# Patient Record
Sex: Female | Born: 2000 | Hispanic: No | Marital: Single | Smoking: Current every day smoker
Health system: Southern US, Community
[De-identification: ages and names within clinical notes are randomized; demographics above are authoritative.]

## PROBLEM LIST (undated history)

## (undated) ENCOUNTER — Inpatient Hospital Stay: Payer: Self-pay

---

## 2018-01-13 ENCOUNTER — Emergency Department
Admission: EM | Admit: 2018-01-13 | Discharge: 2018-01-14 | Disposition: A | Discharge status: Other Acute Inpatient Hospital | Payer: No Typology Code available for payment source | Attending: Emergency Medicine | Admitting: Emergency Medicine

## 2018-01-13 ENCOUNTER — Emergency Department: Payer: No Typology Code available for payment source

## 2018-01-13 DIAGNOSIS — T1491XA Suicide attempt, initial encounter: Secondary | ICD-10-CM | POA: Insufficient documentation

## 2018-01-13 DIAGNOSIS — T424X2A Poisoning by benzodiazepines, intentional self-harm, initial encounter: Secondary | ICD-10-CM | POA: Insufficient documentation

## 2018-01-13 DIAGNOSIS — Y929 Unspecified place or not applicable: Secondary | ICD-10-CM | POA: Insufficient documentation

## 2018-01-13 DIAGNOSIS — X838XXA Intentional self-harm by other specified means, initial encounter: Secondary | ICD-10-CM | POA: Insufficient documentation

## 2018-01-13 DIAGNOSIS — Y999 Unspecified external cause status: Secondary | ICD-10-CM | POA: Insufficient documentation

## 2018-01-13 DIAGNOSIS — R451 Restlessness and agitation: Secondary | ICD-10-CM | POA: Insufficient documentation

## 2018-01-13 DIAGNOSIS — F329 Major depressive disorder, single episode, unspecified: Secondary | ICD-10-CM | POA: Insufficient documentation

## 2018-01-13 DIAGNOSIS — T424X1A Poisoning by benzodiazepines, accidental (unintentional), initial encounter: Secondary | ICD-10-CM

## 2018-01-13 DIAGNOSIS — Y9389 Activity, other specified: Secondary | ICD-10-CM | POA: Insufficient documentation

## 2018-01-13 DIAGNOSIS — F39 Unspecified mood [affective] disorder: Secondary | ICD-10-CM

## 2018-01-13 DIAGNOSIS — F1010 Alcohol abuse, uncomplicated: Secondary | ICD-10-CM

## 2018-01-13 DIAGNOSIS — Z046 Encounter for general psychiatric examination, requested by authority: Secondary | ICD-10-CM | POA: Insufficient documentation

## 2018-01-13 DIAGNOSIS — F1021 Alcohol dependence, in remission: Secondary | ICD-10-CM

## 2018-01-13 MED ORDER — CHARCOAL ACTIVATED PO LIQD
50.0000 g | Freq: Once | ORAL | Status: DC
  Filled 2018-01-13: qty 240

## 2018-01-13 MED ORDER — SODIUM CHLORIDE 0.9 % IV BOLUS
1000.0000 mL | Freq: Once | INTRAVENOUS | Status: AC
Start: 2018-01-14 — End: 2018-01-14
  Administered 2018-01-14: 1000 mL via INTRAVENOUS

## 2018-01-13 NOTE — ED Provider Notes (Signed)
[facility] Emergency Department Provider Note   ____________________________________________   First MD Initiated Contact with Patient 01/13/18 2346     (approximate)  I have reviewed the triage vital signs and the nursing notes.   HISTORY  Chief Complaint Ingestion    HPI Kristina Gay is a 17 y.o. female brought to the ED from home via EMS with a chief complaint of intentional overdose.  Reportedly 2 weeks ago patient had a DUI and her parents placed her in [facility].  Tonight patient posted on Facebook that she intentionally overdosed on 60 Klonopin tablets (unknown milligram) as well as drank alcohol.  Tells me she attempted to vomit after she ingested the pills but did not.  Her mother saw her Facebook post and called 911.  Currently patient voices no medical complaints and is eager for discharge home.  Specifically patient denies recent fever, chills, chest pain, shortness of breath, abdominal pain, nausea or vomiting.  Denies trauma.  She is becoming irritated and cursing at staff.   Past medical history None   There are no active problems to display for this patient.    Prior to Admission medications   Not on File    Allergies Patient has no known allergies.  No family history on file.  Social History Social History   Tobacco Use  . Smoking status: Not on file  Substance Use Topics  . Alcohol use: Not on file  . Drug use: Not on file  + EtOH   Review of Systems  Constitutional: No fever/chills. Eyes: No visual changes. ENT: No sore throat. Cardiovascular: Denies chest pain. Respiratory: Denies shortness of breath. Gastrointestinal: No abdominal pain.  No nausea, no vomiting.  No diarrhea.  No constipation. Genitourinary: Negative for dysuria. Musculoskeletal: Negative for back pain. Skin: Negative for rash. Neurological: Negative for headaches, focal weakness or numbness. Psychiatric:Positive for  suicide attempt.  ____________________________________________   PHYSICAL EXAM:  VITAL SIGNS: ED Triage Vitals  Enc Vitals Group     BP      Pulse      Resp      Temp      Temp src      SpO2      Weight      Height      Head Circumference      Peak Flow      Pain Score      Pain Loc      Pain Edu?      Excl. in GC?     Constitutional: Alert and oriented. Well appearing and in no acute distress. Intoxicated. Eyes: Conjunctivae are normal. PERRL. EOMI. Head: Atraumatic. Nose: No congestion/rhinnorhea. Mouth/Throat: Mucous membranes are moist.  Oropharynx non-erythematous. Neck: No stridor.   Cardiovascular: Normal rate, regular rhythm. Grossly normal heart sounds.  Good peripheral circulation. Respiratory: Normal respiratory effort.  No retractions. Lungs CTAB. Gastrointestinal: Soft and nontender. No distention. No abdominal bruits. No CVA tenderness. Musculoskeletal: No lower extremity tenderness nor edema.  No joint effusions. Neurologic:  Normal speech and language. No gross focal neurologic deficits are appreciated.  Skin:  Skin is warm, dry and intact. No rash noted. Psychiatric: Mood and affect are tearful; at times agitated. Speech and behavior are normal.  ____________________________________________   LABS (all labs ordered are listed, but only abnormal results are displayed)  Labs Reviewed  COMPREHENSIVE METABOLIC PANEL - Abnormal; Notable for the following components:      Result Value   Potassium 3.4 (*)  Glucose, Bld 113 (*)    ALT 12 (*)    All other components within normal limits  ACETAMINOPHEN LEVEL - Abnormal; Notable for the following components:   Acetaminophen (Tylenol), Serum <10 (*)    All other components within normal limits  ETHANOL - Abnormal; Notable for the following components:   Alcohol, Ethyl (B) 105 (*)    All other components within normal limits  URINE DRUG SCREEN, QUALITATIVE ([facility] ONLY) - Abnormal; Notable for the  following components:   Cannabinoid 50 Ng, Ur [place] POSITIVE (*)    Benzodiazepine, Ur Scrn POSITIVE (*)    All other components within normal limits  URINALYSIS, COMPLETE (UACMP) WITH MICROSCOPIC - Abnormal; Notable for the following components:   Color, Urine COLORLESS (*)    APPearance CLEAR (*)    Specific Gravity, Urine 1.003 (*)    Bacteria, UA RARE (*)    All other components within normal limits  BLOOD GAS, ARTERIAL - Abnormal; Notable for the following components:   Bicarbonate 18.8 (*)    Acid-base deficit 5.9 (*)    All other components within normal limits  ACETAMINOPHEN LEVEL - Abnormal; Notable for the following components:   Acetaminophen (Tylenol), Serum <10 (*)    All other components within normal limits  CBC WITH DIFFERENTIAL/PLATELET  SALICYLATE LEVEL  SALICYLATE LEVEL  POC URINE PREG, ED  POCT PREGNANCY, URINE   ____________________________________________  EKG  ED ECG REPORT I, Gayle Collard J, the attending physician, personally viewed and interpreted this ECG.   Date: 01/14/2018  EKG Time: 0000  Rate: 135  Rhythm: sinus tachycardia  Axis: Normal   Intervals:none  ST&T Change: Nonspecific  ____________________________________________  RADIOLOGY  ED MD interpretation: No acute cardiopulmonary process  Official radiology report(s): Dg Chest Port 1 View  Result Date: 01/14/2018 CLINICAL DATA:  Drug overdose EXAM: PORTABLE CHEST 1 VIEW COMPARISON:  None. FINDINGS: Portable AP upright view. Lung volumes are slightly low. The heart size and mediastinal contours are within normal limits. Both lungs are clear. The visualized skeletal structures are unremarkable. IMPRESSION: No active disease. Electronically Signed   By: Tollie Ethavid  Kwon M.D.   On: 01/14/2018 00:18    ____________________________________________   PROCEDURES  Procedure(s) performed: None  Procedures  Critical Care performed: Yes, see critical care note(s)    CRITICAL CARE Performed by:  Irean HongSUNG,Reva Pinkley J   Total critical care time: 45 minutes  Critical care time was exclusive of separately billable procedures and treating other patients.  Critical care was necessary to treat or prevent imminent or life-threatening deterioration.  Critical care was time spent personally by me on the following activities: development of treatment plan with patient and/or surrogate as well as nursing, discussions with consultants, evaluation of patient's response to treatment, examination of patient, obtaining history from patient or surrogate, ordering and performing treatments and interventions, ordering and review of laboratory studies, ordering and review of radiographic studies, pulse oximetry and re-evaluation of patient's condition.  ____________________________________________   INITIAL IMPRESSION / ASSESSMENT AND PLAN / ED COURSE  As part of my medical decision making, I reviewed the following data within the electronic MEDICAL RECORD NUMBER Nursing notes reviewed and incorporated, Labs reviewed, EKG interpreted, Old chart reviewed, Radiograph reviewed, A consult was requested and obtained from this/these consultant(s) Psychiatry and Notes from prior ED visits   17 year old female who presents with intentional overdose of Klonopin and alcohol. Differential diagnosis includes, but is not limited to, alcohol, illicit or prescription medications, or other toxic ingestion; intracranial pathology such as  stroke or intracerebral hemorrhage; fever or infectious causes including sepsis; hypoxemia and/or hypercarbia; uremia; trauma; endocrine related disorders such as diabetes, hypoglycemia, and thyroid-related diseases; hypertensive encephalopathy; etc.   IVC papers are coming from the sheriff's office.  Ingestion of 60 Klonopin approximately 1 hour ago.  Will attempt NG tube lavage, charcoal, initiate IV fluid resuscitation.  Continue cardiac monitoring.  Will call poison control for additional  recommendations.   Clinical Course as of Jan 14 726  Fri Jan 14, 2018  0020 Spoke with poison control who agrees with supportive management and continued cardiac monitoring.  Recommend 4-hour levels of acetaminophen and salicylate.   [JS]  0315 Patient sleeping in no acute distress.  Heart rate down to 115.  Will hang additional liter of IV fluids.  Remains hemodynamically stable.   [JS]  0503 Repeat acetaminophen and salicylate levels unremarkable.  Will continue to monitor patient.   [JS]  0545 Patient vomiting; will administer IV Zofran.  3rd liter normal saline to be infused.   [JS]  0656 Vomiting improved.  Heart rate coming down to 106.  Third liter fluids infusing.  Patient will remain in the emergency department under IVC pending psychiatry evaluation this morning.  Care transferred to Dr. Darnelle Catalan.   [JS]    Clinical Course User Index [JS] Irean Hong, MD     ____________________________________________   FINAL CLINICAL IMPRESSION(S) / ED DIAGNOSES  Final diagnoses:  Overdose of benzodiazepine, intentional self-harm, initial encounter [facility])     ED Discharge Orders    None       Note:  This document was prepared using Dragon voice recognition software and may include unintentional dictation errors.    Irean Hong, MD 01/14/18 [phone]
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
## 2018-01-14 ENCOUNTER — Other Ambulatory Visit: Payer: Self-pay

## 2018-01-14 ENCOUNTER — Encounter: Payer: Self-pay | Admitting: Psychiatry

## 2018-01-14 ENCOUNTER — Inpatient Hospital Stay
Admission: AD | Admit: 2018-01-14 | Discharge: 2018-01-18 | DRG: 882 | Disposition: A | Discharge status: Home / Self Care | Payer: No Typology Code available for payment source | Attending: Psychiatry | Admitting: Psychiatry

## 2018-01-14 DIAGNOSIS — F4323 Adjustment disorder with mixed anxiety and depressed mood: Secondary | ICD-10-CM | POA: Diagnosis not present

## 2018-01-14 DIAGNOSIS — T424X2A Poisoning by benzodiazepines, intentional self-harm, initial encounter: Secondary | ICD-10-CM | POA: Diagnosis not present

## 2018-01-14 DIAGNOSIS — F1010 Alcohol abuse, uncomplicated: Secondary | ICD-10-CM | POA: Diagnosis present

## 2018-01-14 DIAGNOSIS — F1210 Cannabis abuse, uncomplicated: Secondary | ICD-10-CM | POA: Diagnosis present

## 2018-01-14 DIAGNOSIS — F1021 Alcohol dependence, in remission: Secondary | ICD-10-CM

## 2018-01-14 DIAGNOSIS — T424X1A Poisoning by benzodiazepines, accidental (unintentional), initial encounter: Secondary | ICD-10-CM

## 2018-01-14 DIAGNOSIS — Z79899 Other long term (current) drug therapy: Secondary | ICD-10-CM | POA: Diagnosis not present

## 2018-01-14 DIAGNOSIS — F39 Unspecified mood [affective] disorder: Secondary | ICD-10-CM

## 2018-01-14 LAB — URINALYSIS, COMPLETE (UACMP) WITH MICROSCOPIC
Bilirubin Urine: NEGATIVE
Glucose, UA: NEGATIVE mg/dL
Hgb urine dipstick: NEGATIVE
KETONES UR: NEGATIVE mg/dL
LEUKOCYTES UA: NEGATIVE
Nitrite: NEGATIVE
PROTEIN: NEGATIVE mg/dL
Specific Gravity, Urine: 1.003 — ABNORMAL LOW (ref 1.005–1.030)
pH: 6 (ref 5.0–8.0)

## 2018-01-14 LAB — URINE DRUG SCREEN, QUALITATIVE (ARMC ONLY)
AMPHETAMINES, UR SCREEN: NOT DETECTED
Barbiturates, Ur Screen: NOT DETECTED
Benzodiazepine, Ur Scrn: POSITIVE — AB
Cannabinoid 50 Ng, Ur ~~LOC~~: POSITIVE — AB
Cocaine Metabolite,Ur ~~LOC~~: NOT DETECTED
MDMA (ECSTASY) UR SCREEN: NOT DETECTED
METHADONE SCREEN, URINE: NOT DETECTED
OPIATE, UR SCREEN: NOT DETECTED
PHENCYCLIDINE (PCP) UR S: NOT DETECTED
Tricyclic, Ur Screen: NOT DETECTED

## 2018-01-14 LAB — CBC WITH DIFFERENTIAL/PLATELET
BASOS ABS: 0 10*3/uL (ref 0–0.1)
BASOS PCT: 1 %
EOS ABS: 0.1 10*3/uL (ref 0–0.7)
EOS PCT: 1 %
HCT: 41.4 % (ref 35.0–47.0)
HEMOGLOBIN: 14.2 g/dL (ref 12.0–16.0)
Lymphocytes Relative: 20 %
Lymphs Abs: 1.2 10*3/uL (ref 1.0–3.6)
MCH: 31.4 pg (ref 26.0–34.0)
MCHC: 34.2 g/dL (ref 32.0–36.0)
MCV: 91.9 fL (ref 80.0–100.0)
Monocytes Absolute: 0.5 10*3/uL (ref 0.2–0.9)
Monocytes Relative: 8 %
Neutro Abs: 4 10*3/uL (ref 1.4–6.5)
Neutrophils Relative %: 70 %
PLATELETS: 331 10*3/uL (ref 150–440)
RBC: 4.51 MIL/uL (ref 3.80–5.20)
RDW: 13.6 % (ref 11.5–14.5)
WBC: 5.8 10*3/uL (ref 3.6–11.0)

## 2018-01-14 LAB — COMPREHENSIVE METABOLIC PANEL
ALBUMIN: 4.6 g/dL (ref 3.5–5.0)
ALT: 12 U/L — ABNORMAL LOW (ref 14–54)
AST: 19 U/L (ref 15–41)
Alkaline Phosphatase: 76 U/L (ref 38–126)
Anion gap: 11 (ref 5–15)
BUN: 7 mg/dL (ref 6–20)
CHLORIDE: 107 mmol/L (ref 101–111)
CO2: 22 mmol/L (ref 22–32)
CREATININE: 0.63 mg/dL (ref 0.44–1.00)
Calcium: 9.2 mg/dL (ref 8.9–10.3)
GFR calc non Af Amer: >60 mL/min (ref >60)
GLUCOSE: 113 mg/dL — AB (ref 65–99)
Potassium: 3.4 mmol/L — ABNORMAL LOW (ref 3.5–5.1)
SODIUM: 140 mmol/L (ref 135–145)
Total Bilirubin: 0.3 mg/dL (ref 0.3–1.2)
Total Protein: 8.1 g/dL (ref 6.5–8.1)

## 2018-01-14 LAB — BLOOD GAS, ARTERIAL
ACID-BASE DEFICIT: 5.9 mmol/L — AB (ref 0.0–2.0)
BICARBONATE: 18.8 mmol/L — AB (ref 20.0–28.0)
FIO2: 0.21
O2 Saturation: 97.4 %
PATIENT TEMPERATURE: 37
PO2 ART: 100 mmHg (ref 83.0–108.0)
pCO2 arterial: 34 mmHg (ref 32.0–48.0)
pH, Arterial: 7.35 (ref 7.350–7.450)

## 2018-01-14 LAB — ACETAMINOPHEN LEVEL: Acetaminophen (Tylenol), Serum: <10 ug/mL — ABNORMAL LOW (ref 10–30)

## 2018-01-14 LAB — ETHANOL: ALCOHOL ETHYL (B): 105 mg/dL — AB (ref <10)

## 2018-01-14 LAB — SALICYLATE LEVEL: Salicylate Lvl: <7 mg/dL (ref 2.8–30.0)

## 2018-01-14 LAB — POCT PREGNANCY, URINE: Preg Test, Ur: NEGATIVE

## 2018-01-14 MED ORDER — TRAZODONE HCL 100 MG PO TABS
100.0000 mg | ORAL_TABLET | Freq: Every evening | ORAL | Status: DC | PRN
  Administered 2018-01-15 – 2018-01-18 (×2): 100 mg via ORAL
  Filled 2018-01-14 (×2): qty 1

## 2018-01-14 MED ORDER — SODIUM CHLORIDE 0.9 % IV BOLUS
1000.0000 mL | Freq: Once | INTRAVENOUS | Status: AC
  Administered 2018-01-14: 1000 mL via INTRAVENOUS

## 2018-01-14 MED ORDER — OLANZAPINE 10 MG PO TABS
ORAL_TABLET | ORAL | Status: AC
  Administered 2018-01-14: 10 mg via ORAL
  Filled 2018-01-14: qty 1

## 2018-01-14 MED ORDER — ZIPRASIDONE MESYLATE 20 MG IM SOLR: 20.0000 mg | Freq: Four times a day (QID) | INTRAMUSCULAR | Status: DC | PRN

## 2018-01-14 MED ORDER — OLANZAPINE 10 MG PO TABS: 10.0000 mg | ORAL_TABLET | Freq: Four times a day (QID) | ORAL | Status: DC | PRN

## 2018-01-14 MED ORDER — MAGNESIUM HYDROXIDE 400 MG/5ML PO SUSP: 30.0000 mL | Freq: Every day | ORAL | Status: DC | PRN

## 2018-01-14 MED ORDER — OLANZAPINE 10 MG PO TABS
10.0000 mg | ORAL_TABLET | Freq: Four times a day (QID) | ORAL | Status: DC | PRN
  Administered 2018-01-14: 10 mg via ORAL

## 2018-01-14 MED ORDER — ALUM & MAG HYDROXIDE-SIMETH 200-200-20 MG/5ML PO SUSP: 30.0000 mL | ORAL | Status: DC | PRN

## 2018-01-14 MED ORDER — ONDANSETRON HCL 4 MG/2ML IJ SOLN
INTRAMUSCULAR | Status: AC
  Filled 2018-01-14: qty 2

## 2018-01-14 MED ORDER — ZIPRASIDONE MESYLATE 20 MG IM SOLR
INTRAMUSCULAR | Status: AC
  Filled 2018-01-14: qty 20

## 2018-01-14 MED ORDER — ACTIDOSE WITH SORBITOL 50 GM/240ML PO LIQD
50.0000 g | Freq: Once | ORAL | Status: AC
  Administered 2018-01-14: 50 g via ORAL

## 2018-01-14 MED ORDER — ACETAMINOPHEN 325 MG PO TABS: 650.0000 mg | ORAL_TABLET | Freq: Four times a day (QID) | ORAL | Status: DC | PRN

## 2018-01-14 MED ORDER — ONDANSETRON HCL 4 MG/2ML IJ SOLN: 4.0000 mg | Freq: Once | INTRAMUSCULAR | Status: DC

## 2018-01-14 NOTE — ED Notes (Signed)
Mom called and Spoke with her and gave her an update

## 2018-01-14 NOTE — ED Notes (Signed)
Wakens easily to voice. Oriented but drowsy.  Unlabored. Sitter remains present at bedside.

## 2018-01-14 NOTE — ED Notes (Signed)
Patient visting with her mother.

## 2018-01-14 NOTE — ED Notes (Signed)
Poison Control called back and spoke with this RN and given update (PC notified repeat salicylate and acetaminophen levels were not back yet), at this time PC states they are closing her case.
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
## 2018-01-14 NOTE — ED Notes (Signed)
IVC/ Consult completed/ Plan to admit when medically clear  

## 2018-01-14 NOTE — ED Notes (Signed)
Pt had 1 episode of large liquid emesis, EDP notified see MAR for follow up.

## 2018-01-14 NOTE — Consult Note (Signed)
[facility] Consult   Reason for Consult: Consult for 17 year old woman who came into the hospital after taking an overdose of a nasal pain M Referring Physician: Rip Harbour Patient Identification: Kristina Gay MRN:  [id number] Principal Diagnosis: Benzodiazepine overdose Diagnosis:   Patient Active Problem List   Diagnosis Date Noted  . Benzodiazepine overdose [T42.4X1A] 01/14/2018  . Alcohol abuse [F10.10] 01/14/2018  . Mood disorder ([place]) [F39] 01/14/2018    Total Time spent with patient: 1 hour  Subjective:   Kristina Gay is a 17 y.o. female patient admitted with "I was just trying to get high".  HPI: Patient seen chart reviewed.  17 year old woman came into the hospital after taking an overdose of Klonopin.  Patient tells me a slightly different story than what she told when she came in.  She admits that she was over at her boyfriend's house drinking yesterday.  She tells me that it was only part of a alcoholic drink but her blood alcohol level was well over 100 when she came in.  No one was around and she took an entire bottle of Klonopin.  She says she does not even know who his pills they were.  Today she tells me she did it because "I just wanted to get high" but last night when she came in it is documented that she said she wanted to kill herself.  Patient is telling me today that her mood is been good.  Some chronic trouble sleeping.  Says this was only the second time she has had any alcohol since leaving [facility].  Patient is very evasive and minimizes symptoms talking to me but was clearly showing signs of suicidality when she came in last night.  Not clear that she is on any psychiatric medicine or getting any outpatient treatment.  Medical history: No significant medical problems  Substance abuse history: History of abuse of alcohol and benzodiazepines in the past as well.  Just recently out of [facility] where she had been admitted after  drinking and expressing suicidal thoughts.  Social history: Lives with her mother.  Not currently working.  Past Psychiatric History: Patient has been seen before and was admitted to [facility] just a couple weeks or less ago.  History of substance abuse mood instability behavior problems and overdoses.  Risk to Self: Suicidal Ideation: No-Not Currently/Within Last 6 Months Suicidal Intent: No-Not Currently/Within Last 6 Months Is patient at risk for suicide?: Yes Suicidal Plan?: No-Not Currently/Within Last 6 Months Access to Means: Yes Specify Access to Suicidal Means: Medications What has been your use of drugs/alcohol within the last 12 months?: Benzo's & Medications How many times?: 0 Other Self Harm Risks: Active Addictions Triggers for Past Attempts: Other (Comment) Intentional Self Injurious Behavior: None Risk to Others: Homicidal Ideation: No Thoughts of Harm to Others: No Current Homicidal Intent: No Current Homicidal Plan: No Access to Homicidal Means: No Identified Victim: Reports of none History of harm to others?: No Assessment of Violence: None Noted Violent Behavior Description: Reports of none Does patient have access to weapons?: No Criminal Charges Pending?: No Does patient have a court date: No Prior Inpatient Therapy: Prior Inpatient Therapy: Yes Prior Therapy Dates: 11/2017 Prior Therapy Facilty/Provider(s): [facility] Reason for Treatment: Depression Prior Outpatient Therapy: Prior Outpatient Therapy: No Does patient have an ACCT team?: No Does patient have Intensive In-House Services?  : No Does patient have [facility] services? : No Does patient have P4CC services?: No  Past Medical History:  No past medical history on file.  Family History: No family history on file. Family Psychiatric  History: Unknown Social History:  Social History   Substance and Sexual Activity  Alcohol Use Not on file     Social History   Substance and Sexual  Activity  Drug Use Not on file    Social History   Socioeconomic History  . Marital status: Single    Spouse name: Not on file  . Number of children: Not on file  . Years of education: Not on file  . Highest education level: Not on file  Occupational History  . Not on file  Social Needs  . Financial resource strain: Not on file  . Food insecurity:    Worry: Not on file    Inability: Not on file  . Transportation needs:    Medical: Not on file    Non-medical: Not on file  Tobacco Use  . Smoking status: Not on file  Substance and Sexual Activity  . Alcohol use: Not on file  . Drug use: Not on file  . Sexual activity: Not on file  Lifestyle  . Physical activity:    Days per week: Not on file    Minutes per session: Not on file  . Stress: Not on file  Relationships  . Social connections:    Talks on phone: Not on file    Gets together: Not on file    Attends religious service: Not on file    Active member of club or organization: Not on file    Attends meetings of clubs or organizations: Not on file    Relationship status: Not on file  Other Topics Concern  . Not on file  Social History Narrative  . Not on file   Additional Social History:    Allergies:  No Known Allergies  Labs:  Results for orders placed or performed during the hospital encounter of 01/13/18 (from the past 48 hour(s))  CBC with Differential     Status: None   Collection Time: 01/13/18 11:50 PM  Result Value Ref Range   WBC 5.8 3.6 - 11.0 K/uL   RBC 4.51 3.80 - 5.20 MIL/uL   Hemoglobin 14.2 12.0 - 16.0 g/dL   HCT 41.4 35.0 - 47.0 %   MCV 91.9 80.0 - 100.0 fL   MCH 31.4 26.0 - 34.0 pg   MCHC 34.2 32.0 - 36.0 g/dL   RDW 13.6 11.5 - 14.5 %   Platelets 331 150 - 440 K/uL   Neutrophils Relative % 70 %   Neutro Abs 4.0 1.4 - 6.5 K/uL   Lymphocytes Relative 20 %   Lymphs Abs 1.2 1.0 - 3.6 K/uL   Monocytes Relative 8 %   Monocytes Absolute 0.5 0.2 - 0.9 K/uL   Eosinophils Relative 1 %    Eosinophils Absolute 0.1 0 - 0.7 K/uL   Basophils Relative 1 %   Basophils Absolute 0.0 0 - 0.1 K/uL    Comment: Performed at [facility], [address] [place], [place] [address]  Comprehensive metabolic panel     Status: Abnormal   Collection Time: 01/13/18 11:50 PM  Result Value Ref Range   Sodium 140 135 - 145 mmol/L   Potassium 3.4 (L) 3.5 - 5.1 mmol/L   Chloride 107 101 - 111 mmol/L   CO2 22 22 - 32 mmol/L   Glucose, Bld 113 (H) 65 - 99 mg/dL   BUN 7 6 - 20 mg/dL   Creatinine,  Ser 0.63 0.44 - 1.00 mg/dL   Calcium 9.2 8.9 - 10.3 mg/dL   Total Protein 8.1 6.5 - 8.1 g/dL   Albumin 4.6 3.5 - 5.0 g/dL   AST 19 15 - 41 U/L   ALT 12 (L) 14 - 54 U/L   Alkaline Phosphatase 76 38 - 126 U/L   Total Bilirubin 0.3 0.3 - 1.2 mg/dL   GFR calc non Af Amer >60 >60 mL/min   GFR calc Af Amer >60 >60 mL/min    Comment: (NOTE) The eGFR has been calculated using the CKD EPI equation. This calculation has not been validated in all clinical situations. eGFR's persistently <60 mL/min signify possible Chronic Kidney Disease.    Anion gap 11 5 - 15    Comment: Performed at [facility], [address] [place], [place] [address]  Acetaminophen level     Status: Abnormal   Collection Time: 01/13/18 11:50 PM  Result Value Ref Range   Acetaminophen (Tylenol), Serum <10 (L) 10 - 30 ug/mL    Comment:        THERAPEUTIC CONCENTRATIONS VARY SIGNIFICANTLY. A RANGE OF 10-30 ug/mL MAY BE AN EFFECTIVE CONCENTRATION FOR MANY PATIENTS. HOWEVER, SOME ARE BEST TREATED AT CONCENTRATIONS OUTSIDE THIS RANGE. ACETAMINOPHEN CONCENTRATIONS >150 ug/mL AT 4 HOURS AFTER INGESTION AND >50 ug/mL AT 12 HOURS AFTER INGESTION ARE OFTEN ASSOCIATED WITH TOXIC REACTIONS. Performed at [facility], [address] [place], [place] [address]   Salicylate level     Status: None   Collection Time: 01/13/18 11:50 PM  Result Value Ref Range   Salicylate Lvl <9.3 2.8 - 30.0 mg/dL     Comment: Performed at [facility], [address] [place], [place] [address]  Ethanol     Status: Abnormal   Collection Time: 01/13/18 11:50 PM  Result Value Ref Range   Alcohol, Ethyl (B) 105 (H) <10 mg/dL    Comment:        LOWEST DETECTABLE LIMIT FOR SERUM ALCOHOL IS 10 mg/dL FOR MEDICAL PURPOSES ONLY Performed at [facility], [address], [place], [place] [address]   Urine Drug Screen, Qualitative     Status: Abnormal   Collection Time: 01/13/18 11:50 PM  Result Value Ref Range   Tricyclic, Ur Screen NONE DETECTED NONE DETECTED   Amphetamines, Ur Screen NONE DETECTED NONE DETECTED   MDMA (Ecstasy)Ur Screen NONE DETECTED NONE DETECTED   Cocaine Metabolite,Ur [place] NONE DETECTED NONE DETECTED   Opiate, Ur Screen NONE DETECTED NONE DETECTED   Phencyclidine (PCP) Ur S NONE DETECTED NONE DETECTED   Cannabinoid 50 Ng, Ur [place] POSITIVE (A) NONE DETECTED   Barbiturates, Ur Screen NONE DETECTED NONE DETECTED   Benzodiazepine, Ur Scrn POSITIVE (A) NONE DETECTED   Methadone Scn, Ur NONE DETECTED NONE DETECTED    Comment: (NOTE) Tricyclics + metabolites, urine    Cutoff 1000 ng/mL Amphetamines + metabolites, urine  Cutoff 1000 ng/mL MDMA (Ecstasy), urine              Cutoff 500 ng/mL Cocaine Metabolite, urine          Cutoff 300 ng/mL Opiate + metabolites, urine        Cutoff 300 ng/mL Phencyclidine (PCP), urine         Cutoff 25 ng/mL Cannabinoid, urine                 Cutoff 50 ng/mL Barbiturates + metabolites, urine  Cutoff 200 ng/mL Benzodiazepine, urine  Cutoff 200 ng/mL Methadone, urine                   Cutoff 300 ng/mL The urine drug screen provides only a preliminary, unconfirmed analytical test result and should not be used for non-medical purposes. Clinical consideration and professional judgment should be applied to any positive drug screen result due to possible interfering substances. A more specific alternate chemical method must be used  in order to obtain a confirmed analytical result. Gas chromatography / mass spectrometry (GC/MS) is the preferred confirmat ory method. Performed at [facility], [address] [place], [place] [address]   Urinalysis, Complete w Microscopic     Status: Abnormal   Collection Time: 01/13/18 11:50 PM  Result Value Ref Range   Color, Urine COLORLESS (A) YELLOW   APPearance CLEAR (A) CLEAR   Specific Gravity, Urine 1.003 (L) 1.005 - 1.030   pH 6.0 5.0 - 8.0   Glucose, UA NEGATIVE NEGATIVE mg/dL   Hgb urine dipstick NEGATIVE NEGATIVE   Bilirubin Urine NEGATIVE NEGATIVE   Ketones, ur NEGATIVE NEGATIVE mg/dL   Protein, ur NEGATIVE NEGATIVE mg/dL   Nitrite NEGATIVE NEGATIVE   Leukocytes, UA NEGATIVE NEGATIVE   RBC / HPF 0-5 0 - 5 RBC/hpf   WBC, UA 0-5 0 - 5 WBC/hpf   Bacteria, UA RARE (A) NONE SEEN   Squamous Epithelial / LPF 0-5 0 - 5    Comment: Please note change in reference range. Performed at [facility], [address] [place], [place] [address]   Blood gas, arterial     Status: Abnormal   Collection Time: 01/13/18 11:50 PM  Result Value Ref Range   FIO2 0.21    Delivery systems ROOM AIR    pH, Arterial 7.35 7.350 - 7.450   pCO2 arterial 34 32.0 - 48.0 mmHg   pO2, Arterial 100 83.0 - 108.0 mmHg   Bicarbonate 18.8 (L) 20.0 - 28.0 mmol/L   Acid-base deficit 5.9 (H) 0.0 - 2.0 mmol/L   O2 Saturation 97.4 %   Patient temperature 37.0    Collection site RIGHT RADIAL    Sample type ARTERIAL DRAW    Allens test (pass/fail) PASS PASS    Comment: Performed at [facility], [address] [place], [place] [address]  Pregnancy, urine POC     Status: None   Collection Time: 01/14/18 12:49 AM  Result Value Ref Range   Preg Test, Ur NEGATIVE NEGATIVE    Comment:        THE SENSITIVITY OF THIS METHODOLOGY IS >24 mIU/mL   Acetaminophen level     Status: Abnormal   Collection Time: 01/14/18  3:40 AM  Result Value Ref Range   Acetaminophen  (Tylenol), Serum <10 (L) 10 - 30 ug/mL    Comment:        THERAPEUTIC CONCENTRATIONS VARY SIGNIFICANTLY. A RANGE OF 10-30 ug/mL MAY BE AN EFFECTIVE CONCENTRATION FOR MANY PATIENTS. HOWEVER, SOME ARE BEST TREATED AT CONCENTRATIONS OUTSIDE THIS RANGE. ACETAMINOPHEN CONCENTRATIONS >150 ug/mL AT 4 HOURS AFTER INGESTION AND >50 ug/mL AT 12 HOURS AFTER INGESTION ARE OFTEN ASSOCIATED WITH TOXIC REACTIONS. Performed at [facility], [address] [place], [place] [address]   Salicylate level     Status: None   Collection Time: 01/14/18  3:40 AM  Result Value Ref Range   Salicylate Lvl <4.1 2.8 - 30.0 mg/dL    Comment: Performed at [facility], [address], [place],  [address]  Current Facility-Administered Medications  Medication Dose Route Frequency Provider Last Rate Last Dose  . OLANZapine (ZYPREXA) tablet 10 mg  10 mg Oral Q6H PRN Nobuo Nunziata T, MD      . ondansetron (ZOFRAN) injection 4 mg  4 mg Intravenous Once Paulette Blanch, MD      . ziprasidone (GEODON) injection 20 mg  20 mg Intramuscular Q6H PRN Celeste Candelas, Madie Reno, MD       No current outpatient medications on file.    Musculoskeletal: Strength & Muscle Tone: within normal limits Gait & Station: normal Patient leans: N/A  Psychiatric Specialty Exam: Physical Exam  Nursing note and vitals reviewed. Constitutional: She appears well-developed and well-nourished.  HENT:  Head: Normocephalic and atraumatic.  Eyes: Pupils are equal, round, and reactive to light. Conjunctivae are normal.  Neck: Normal range of motion.  Cardiovascular: Regular rhythm and normal heart sounds.  Respiratory: Effort normal. No respiratory distress.  GI: Soft.  Musculoskeletal: Normal range of motion.  Neurological: She is alert.  Skin: Skin is warm and dry.  Psychiatric: Her mood appears anxious. Her affect is angry and labile. Her speech is rapid and/or pressured and tangential. She is agitated. Cognition  and memory are impaired. She expresses impulsivity and inappropriate judgment. She expresses suicidal ideation. She expresses no suicidal plans. She exhibits abnormal recent memory and abnormal remote memory.    Review of Systems  Constitutional: Negative.   HENT: Negative.   Eyes: Negative.   Respiratory: Negative.   Cardiovascular: Negative.   Gastrointestinal: Negative.   Musculoskeletal: Negative.   Skin: Negative.   Neurological: Negative.   Psychiatric/Behavioral: Positive for depression, memory loss and substance abuse. Negative for hallucinations and suicidal ideas. The patient is nervous/anxious and has insomnia.     Blood pressure 116/80, pulse (!) 112, temperature 98.6 F (37 C), temperature source Oral, resp. rate (!) 23, weight 120 lb (54.4 kg), SpO2 100 %.There is no height or weight on file to calculate BMI.  General Appearance: Fairly Groomed  Eye Contact:  Fair  Speech:  Pressured  Volume:  Increased  Mood:  Angry, Anxious and Irritable  Affect:  Inappropriate and Labile  Thought Process:  Disorganized  Orientation:  Full (Time, Place, and Person)  Thought Content:  Illogical and Tangential  Suicidal Thoughts:  Yes.  without intent/plan  Homicidal Thoughts:  No  Memory:  Immediate;   Fair Recent;   Fair Remote;   Fair  Judgement:  Impaired  Insight:  Lacking  Psychomotor Activity:  Increased and Restlessness  Concentration:  Concentration: Poor  Recall:  Poor  Fund of Knowledge:  Fair  Language:  Fair  Akathisia:  No  Handed:  Right  AIMS (if indicated):     Assets:  Physical Health  ADL's:  Impaired  Cognition:  Impaired,  Mild  Sleep:        Treatment Plan Summary: Daily contact with patient to assess and evaluate symptoms and progress in treatment, Medication management and Plan 17 year old woman who took an overdose of Klonopin and was admitting to suicidal thought.  Today she remains sad upset emotionally labile.  When I told her that I would be  admitting her to the psychiatric hospital she became overwrought tearful screaming and agitated.  Patient clearly not taking care of herself well no insight no improvement in behavior after recent hospitalization.  Continue IV C.  Orders done for admission to the hospital.  Orders done for PRN medicine for sedation at this point.  Disposition: Recommend  psychiatric Inpatient admission when medically cleared. Supportive therapy provided about ongoing stressors.  Alethia Berthold, MD 01/14/2018 3:57 PM

## 2018-01-14 NOTE — ED Notes (Signed)
Kao's mom was allowed to visit briefly. It went well. Pt has been calmer.

## 2018-01-14 NOTE — ED Notes (Signed)
Called and spoke with mom (mom's number is [phone])

## 2018-01-14 NOTE — ED Notes (Signed)
Patient got transferred from main ED.Patient is alert and oriented,sad and tearful states "I don't want to stay here."Support and encouragement given.Oriented to unit.Informed about 15 min checks and camera monitoring.Patient sitting at the day room now.

## 2018-01-14 NOTE — ED Notes (Signed)
Patient has been cussing at staff and is angry that she is here and asking when she can leave.

## 2018-01-14 NOTE — ED Notes (Signed)
Patient upset wanting to leave, had Dr.Clapaac to see patient.

## 2018-01-14 NOTE — BH Assessment (Signed)
Patient is to be admitted to [facility] by Dr. Toni Amendlapacs.  Attending Physician will be Dr. Johnella MoloneyMcNew.   Patient [facility] [place].   ER staff is aware of the admission:  Glenda, ER Sectary   Dr. Marisa SeverinSiadecki, ER MD   Clydie BraunKaren, Patient's Nurse   Mertie ClauseJeanelle, Patient Access.

## 2018-01-14 NOTE — ED Notes (Signed)
Pt refuses zofran, states "I don't need that, I just want water." Pt was notified reason for medication, pt states "I'm not going to throw up anymore, I threw up that stuff they made me drink." Pt states she will get the IV fluids. EDP states pt can have ice chips, ice chips given to pt. 1:1 sitter at bedside.

## 2018-01-14 NOTE — ED Notes (Signed)
Pt was told by a staff member that phone time began at 1700 and began demanding to know why she could not leave. This writer attempted to talk with her and she kept interrupting, "I don't need to be here! I should be able to leave! I only came here to get my stomach pumped! I was only trying to get high!" She demanded to "speak with someone who came help me." Dr. Toni Amendlapacs spoke with pt, who remained irate.

## 2018-01-14 NOTE — ED Notes (Signed)
Patient refused offered PM snack. 

## 2018-01-14 NOTE — ED Notes (Signed)
Patient crying and yelling at the phone.Aunt visited.
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
## 2018-01-14 NOTE — ED Notes (Signed)

## 2018-01-14 NOTE — ED Notes (Signed)
PT IVC BY BPD/MD SUNG, RN REBECCA AND ODS SECURITY MADE AWARE/PT PENDING PSYCH CONSULT.

## 2018-01-15 DIAGNOSIS — T424X2A Poisoning by benzodiazepines, intentional self-harm, initial encounter: Secondary | ICD-10-CM

## 2018-01-15 LAB — LIPID PANEL
Cholesterol: 156 mg/dL (ref 0–169)
HDL: 53 mg/dL (ref >40)
LDL Cholesterol: 78 mg/dL (ref 0–99)
Total CHOL/HDL Ratio: 2.9 RATIO
Triglycerides: 123 mg/dL (ref <150)
VLDL: 25 mg/dL (ref 0–40)

## 2018-01-15 LAB — HEMOGLOBIN A1C
HEMOGLOBIN A1C: 4.7 % — AB (ref 4.8–5.6)
MEAN PLASMA GLUCOSE: 88.19 mg/dL

## 2018-01-15 LAB — TSH: TSH: 1.228 u[IU]/mL (ref 0.350–4.500)

## 2018-01-15 MED ORDER — FLUOXETINE HCL 10 MG PO CAPS
10.0000 mg | ORAL_CAPSULE | Freq: Every day | ORAL | Status: DC
  Administered 2018-01-16 – 2018-01-18 (×3): 10 mg via ORAL
  Filled 2018-01-15 (×3): qty 1

## 2018-01-15 NOTE — Progress Notes (Signed)
This is an 17 Y.O female presenting involuntarily after overdosing on Clonopin. Patient reports that she did not intend to kill herself  But wanted to get high. Reports that she did not have a prescription for Clonopin, that she got it from "somebody". Has recent history of psychiatric inpatient including [facility] and [facility].Admits that she smokes Marijuana occasionally but denies other drugs. Reports occasional use of alcohol and did not have any for a couple of weeks. Patient currently denies thoughts of self harm and feels like she did not need to be here. Initial assessment complete and skin assessment performed by this Clinical research associate, staffed with nurse Ossie B. Patient has multiple,  clean tattoes but no other findings. Was admitted and oriented to the unit. Safety precautions initiated. Will be evaluated in AM.
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
## 2018-01-15 NOTE — [facility] Group Notes (Signed)
LCSW Group Therapy Note  01/15/2018 1:15pm  Type of Therapy and Topic:  Group Therapy:  Cognitive Distortions  Participation Level:  Active   Description of Group:    Patients in this group will be introduced to the topic of cognitive distortions.  Patients will identify and describe cognitive distortions, describe the feelings these distortions create for them.  Patients will identify one or more situations in their personal life where they have cognitively distorted thinking and will verbalize challenging this cognitive distortion through positive thinking skills.  Patients will practice the skill of using positive affirmations to challenge cognitive distortions using affirmation cards.    Therapeutic Goals:  1. Patient will identify two or more cognitive distortions they have used 2. Patient will identify one or more emotions that stem from use of a cognitive distortion 3. Patient will demonstrate use of a positive affirmation to counter a cognitive distortion through discussion and/or role play. 4. Patient will describe one way cognitive distortions can be detrimental to wellness   Summary of Patient Progress: The patient reported she feels "good." Patients were introduced to the topic of cognitive distortions.  Patient was able to identify and describe cognitive distortions. She described the feelings these distortions create for her.  Patient identified situations in her personal life where she has cognitively distorted thinking and verbalized challenging these cognitive distortion through positive thinking skills.  Patients practiced the skill of using positive affirmations to challenge cognitive distortions using affirmation cards. Patient actively engaged in group discussion.        Therapeutic Modalities:   Cognitive Behavioral Therapy Motivational Interviewing   Dominion Kathan  CUEBAS-COLON, LCSW 01/15/2018 11:35 AM

## 2018-01-15 NOTE — H&P (Addendum)
Psychiatric Admission Assessment Adult  Patient Identification: Kristina Gay MRN:  [id number] Date of Evaluation:  01/15/2018 Chief Complaint:  Major depressive disorder Principal Diagnosis: <principal problem not specified> Diagnosis:   Patient Active Problem List   Diagnosis Date Noted  . Benzodiazepine overdose [T42.4X1A] 01/14/2018  . Alcohol abuse [F10.10] 01/14/2018  . Mood disorder (HCC) [F39] 01/14/2018   History of Present Illness: 17 yo with benzo overdose.  Kristina Gay states that she has been stressed, "a lot on my mind" so she was drinking at her boyfriend's house and then took a bottle of pills.  She states she wanted to just forget her stress but denies wanting to kill herself.  She states it was the second time she had drank since leaving [facility] about a month ago.  When I ask her what is stressing her she shrugs her shoulders and refuses to answer.  I told her it sounds like she is depressed and anxious, would she like to try an antidepressant.  She was hesitant at first but with some psychoeducation agreed to try fluoxetine.  Also mentioned she has a court case on Monday for speeding and marijuana charges.    Associated Signs/Symptoms: Depression Symptoms:  depressed mood, difficulty concentrating, suicidal attempt, anxiety, (Hypo) Manic Symptoms:  Impulsivity, Anxiety Symptoms:  unclear, pt reserved Psychotic Symptoms:  denies PTSD Symptoms: NA Total Time spent with patient: 30 minutes  Past Psychiatric History: psych admission in the past month  Is the patient at risk to self? Yes.    Has the patient been a risk to self in the past 6 months? Yes.    Has the patient been a risk to self within the distant past? No.  Is the patient a risk to others? No.  Has the patient been a risk to others in the past 6 months? No.  Has the patient been a risk to others within the distant past? No.   Prior Inpatient Therapy:   [facility] 11/2017 Prior Outpatient  Therapy:   none    Substance Abuse History in the last 12 months:  Yes.   Consequences of Substance Abuse: Medical Consequences:  overdose on clonazepam while drinking  Legal Consequences:  court case for marijuana possession coming up Previous Psychotropic Medications: no Psychological Evaluations:unknown  Past Medical History: History reviewed. No pertinent past medical history. History reviewed. No pertinent surgical history. Family History: History reviewed. No pertinent family history. Family Psychiatric  History: no FH on file  Tobacco Screening:   none  Social History:  Social History   Substance and Sexual Activity  Alcohol Use Not Currently     Social History   Substance and Sexual Activity  Drug Use Yes  . Types: Marijuana   Comment: occasionally    Additional Social History: Marital status: Single Are you sexually active?: No What is your sexual orientation?: straight Has your sexual activity been affected by drugs, alcohol, medication, or emotional stress?: n/a Does patient have children?: No                         Allergies:  No Known Allergies Lab Results:  Results for orders placed or performed during the hospital encounter of 01/14/18 (from the past 48 hour(s))  Hemoglobin A1c     Status: Abnormal   Collection Time: 01/15/18  7:05 AM  Result Value Ref Range   Hgb A1c MFr Bld 4.7 (L) 4.8 - 5.6 %    Comment: (NOTE) Pre diabetes:  5.7%-6.4% Diabetes:              >6.4% Glycemic control for   <7.0% adults with diabetes    Mean Plasma Glucose 88.19 mg/dL    Comment: Performed at [facility], [address], [place], [place] [address]  Lipid panel     Status: None   Collection Time: 01/15/18  7:05 AM  Result Value Ref Range   Cholesterol 156 0 - 169 mg/dL   Triglycerides 914 <782 mg/dL   HDL 53 >95 mg/dL   Total CHOL/HDL Ratio 2.9 RATIO   VLDL 25 0 - 40 mg/dL   LDL Cholesterol 78 0 - 99 mg/dL    Comment:        Total  Cholesterol/HDL:CHD Risk Coronary Heart Disease Risk Table                     Men   Women  1/2 Average Risk   3.4   3.3  Average Risk       5.0   4.4  2 X Average Risk   9.6   7.1  3 X Average Risk  23.4   11.0        Use the calculated Patient Ratio above and the CHD Risk Table to determine the patient's CHD Risk.        ATP III CLASSIFICATION (LDL):  <100     mg/dL   Optimal  621-308  mg/dL   Near or Above                    Optimal  130-159  mg/dL   Borderline  657-846  mg/dL   High  >962     mg/dL   Very High Performed at [facility], [address] [place], [place] [address]   TSH     Status: None   Collection Time: 01/15/18  7:05 AM  Result Value Ref Range   TSH 1.228 0.350 - 4.500 uIU/mL    Comment: Performed by a 3rd Generation assay with a functional sensitivity of <=0.01 uIU/mL. Performed at [facility], [address] [place], [place] [address]     Blood Alcohol level:  Lab Results  Component Value Date   ETH 105 (H) 01/13/2018    Metabolic Disorder Labs:  Lab Results  Component Value Date   HGBA1C 4.7 (L) 01/15/2018   MPG 88.19 01/15/2018   No results found for: PROLACTIN Lab Results  Component Value Date   CHOL 156 01/15/2018   TRIG 123 01/15/2018   HDL 53 01/15/2018   CHOLHDL 2.9 01/15/2018   VLDL 25 01/15/2018   LDLCALC 78 01/15/2018    Current Medications: Current Facility-Administered Medications  Medication Dose Route Frequency Provider Last Rate Last Dose  . acetaminophen (TYLENOL) tablet 650 mg  650 mg Oral Q6H PRN Clapacs, John T, MD      . alum & mag hydroxide-simeth (MAALOX/MYLANTA) 200-200-20 MG/5ML suspension 30 mL  30 mL Oral Q4H PRN Clapacs, John T, MD      . FLUoxetine (PROZAC) capsule 10 mg  10 mg Oral Daily Cindee Lame, MD      . magnesium hydroxide (MILK OF MAGNESIA) suspension 30 mL  30 mL Oral Daily PRN Clapacs, John T, MD      . OLANZapine (ZYPREXA) tablet 10 mg  10 mg Oral Q6H PRN Clapacs,  John T, MD      . traZODone (DESYREL) tablet 100 mg  100 mg Oral QHS PRN Clapacs, John T, MD      . ziprasidone (GEODON) injection 20 mg  20 mg Intramuscular Q6H PRN Clapacs, Jackquline Denmark, MD       PTA Medications: No medications prior to admission.    Musculoskeletal: Strength & Muscle Tone: within normal limits Gait & Station: normal Patient leans: N/A  Psychiatric Specialty Exam: Physical Exam  Constitutional: She appears well-developed and well-nourished.    Review of Systems  Constitutional: Negative.   Respiratory: Negative.   Cardiovascular: Negative.   Gastrointestinal: Negative.   Musculoskeletal: Negative.   Skin: Negative.   Neurological: Negative.   Psychiatric/Behavioral: Positive for depression.    Blood pressure 123/76, pulse (!) 138, temperature 98.5 F (36.9 C), temperature source Oral, resp. rate 18, height 5' (1.524 m), weight 56.7 kg (125 lb), SpO2 100 %.Body mass index is 24.41 kg/m.  General Appearance: Disheveled did the interview in her bra, declined the offer to let her get dressed   Eye Contact:  Fair  Speech:  Clear and Coherent  Volume:  Normal  Mood:  Dysphoric  Affect:  Congruent  Thought Process:  Coherent  Orientation:  Full (Time, Place, and Person)  Thought Content:  Logical  Suicidal Thoughts:  Yes.  with intent/plan  Homicidal Thoughts:  No  Memory:  Immediate;   Good Recent;   Good Remote;   Good  Judgement:  Poor  Insight:  Lacking  Psychomotor Activity:  Normal  Concentration:  Concentration: Fair and Attention Span: Fair  Recall:  [employer] of Knowledge:  Fair  Language:  Good  Akathisia:  No  Handed:  Right  AIMS (if indicated):     Assets:  Housing Leisure Time Physical Health  ADL's:  Intact  Cognition:  WNL  Sleep:  Number of Hours: 6    Treatment Plan Summary: Daily contact with patient to assess and evaluate symptoms and progress in treatment and Medication management   17 yo with mood disorder and substance  abuse (alcohol, marijuana) presenting with suicide attempt.  She has little insight to her behaviors and also difficulty acknowledging and expressing her emotional struggles.    -will start fluoxetine  for mod  -lipid panel, a1c, tsh normal  -will need an outpt psychiatrist at discharge   Physician Treatment Plan for Primary Diagnosis: Active Problems:   Benzodiazepine overdose  Long Term Goal(s): Improvement in symptoms so as ready for discharge  Short Term Goals: Ability to verbalize feelings will improve, Ability to demonstrate self-control will improve and Ability to identify and develop effective coping behaviors will improve  I certify that inpatient services furnished can reasonably be expected to improve the patient's condition.    Cindee Lame, MD 4/27/20192:49 PM

## 2018-01-15 NOTE — Plan of Care (Signed)
Newly admitted 

## 2018-01-15 NOTE — Progress Notes (Signed)
Received Kristina Gay this AM in her room after breakfast, she denied all of the psychiatric symptoms and verbalized she was not trying to kill herself, just get high. She has been OOB in the milieu at intervals throughout the morning.

## 2018-01-15 NOTE — Tx Team (Signed)
Initial Treatment Plan 01/15/2018 12:24 AM Kristina Gay [id number]    PATIENT STRESSORS: Educational concerns Loss of parent: dad in jail Substance abuse   PATIENT STRENGTHS: Ability for insight Wellsite geologist fund of knowledge Supportive family/friends   PATIENT IDENTIFIED PROBLEMS: Depression  Substance use                   DISCHARGE CRITERIA:  Ability to meet basic life and health needs Improved stabilization in mood, thinking, and/or behavior Motivation to continue treatment in a less acute level of care Verbal commitment to aftercare and medication compliance  PRELIMINARY DISCHARGE PLAN: Outpatient therapy Participate in family therapy Return to previous living arrangement  PATIENT/FAMILY INVOLVEMENT: This treatment plan has been presented to and reviewed with the patient, Kristina Gay.  The patient has been given the opportunity to ask questions and make suggestions.  Olin Pia, RN 01/15/2018, 12:24 AM
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
## 2018-01-16 NOTE — Plan of Care (Signed)
Pt has been engaging with peers. Pt is calm and cooperative upon approach. Pt verbalized she needed something for sleep. Pt educated on trazodone and the effects of the medication. Pt verbalized understanding. Pt given trazodone per MAR. Pt denies ah/vh/si/hi/pain at this time. Remains on Q 15 mins safety checks. Will cont to monitor pt.  Problem: Education: Goal: Utilization of techniques to improve thought processes will improve Outcome: Progressing Goal: Knowledge of the prescribed therapeutic regimen will improve Outcome: Progressing   Problem: Activity: Goal: Interest or engagement in leisure activities will improve Outcome: Progressing   Problem: Coping: Goal: Coping ability will improve Outcome: Progressing Goal: Will verbalize feelings Outcome: Progressing   Problem: Health Behavior/Discharge Planning: Goal: Compliance with therapeutic regimen will improve Outcome: Progressing   Problem: Safety: Goal: Ability to disclose and discuss suicidal ideas will improve Outcome: Progressing Goal: Ability to identify and utilize support systems that promote safety will improve Outcome: Progressing   Problem: Self-Concept: Goal: Will verbalize positive feelings about self Outcome: Progressing   Problem: Education: Goal: Knowledge of [facility] Education information/materials will improve Outcome: Progressing   Problem: Education: Goal: Understanding of discharge needs will improve Outcome: Progressing   Problem: Health Behavior/Discharge Planning: Goal: Ability to identify changes in lifestyle to reduce recurrence of condition will improve Outcome: Progressing   Problem: Physical Regulation: Goal: Complications related to the disease process, condition or treatment will be avoided or minimized Outcome: Progressing

## 2018-01-16 NOTE — Progress Notes (Signed)
[facility] MD Progress Note  01/16/2018 3:05 PM Kristina Gay  MRN:  [id number] Subjective: Kristina Gay is sitting in the dayroom.  She states she is feeling okay, ready to go home.  She denies side effects from fluoxetine.  She maintains poor insight to how dangerous her behaviors were.  She denies SI.   Principal Problem: <principal problem not specified> Diagnosis:   Patient Active Problem List   Diagnosis Date Noted  . Benzodiazepine overdose [T42.4X1A] 01/14/2018  . Alcohol abuse [F10.10] 01/14/2018  . Mood disorder (HCC) [F39] 01/14/2018   Total Time spent with patient: 15 minutes  Past Psychiatric History: see h&p  Past Medical History: History reviewed. No pertinent past medical history. History reviewed. No pertinent surgical history. Family History: History reviewed. No pertinent family history. Family Psychiatric  History: see h&p Social History:  Social History   Substance and Sexual Activity  Alcohol Use Not Currently     Social History   Substance and Sexual Activity  Drug Use Yes  . Types: Marijuana   Comment: occasionally    Social History   Socioeconomic History  . Marital status: Single    Spouse name: Not on file  . Number of children: Not on file  . Years of education: Not on file  . Highest education level: Not on file  Occupational History  . Not on file  Social Needs  . Financial resource strain: Not on file  . Food insecurity:    Worry: Not on file    Inability: Not on file  . Transportation needs:    Medical: Not on file    Non-medical: Not on file  Tobacco Use  . Smoking status: Never Smoker  . Smokeless tobacco: Never Used  Substance and Sexual Activity  . Alcohol use: Not Currently  . Drug use: Yes    Types: Marijuana    Comment: occasionally  . Sexual activity: Yes    Birth control/protection: Injection  Lifestyle  . Physical activity:    Days per week: Not on file    Minutes per session: Not on file  . Stress: Not on file   Relationships  . Social connections:    Talks on phone: Not on file    Gets together: Not on file    Attends religious service: Not on file    Active member of club or organization: Not on file    Attends meetings of clubs or organizations: Not on file    Relationship status: Not on file  Other Topics Concern  . Not on file  Social History Narrative  . Not on file   Additional Social History:                         Sleep: Good  Appetite:  Good  Current Medications: Current Facility-Administered Medications  Medication Dose Route Frequency Provider Last Rate Last Dose  . acetaminophen (TYLENOL) tablet 650 mg  650 mg Oral Q6H PRN Clapacs, John T, MD      . alum & mag hydroxide-simeth (MAALOX/MYLANTA) 200-200-20 MG/5ML suspension 30 mL  30 mL Oral Q4H PRN Clapacs, John T, MD      . FLUoxetine (PROZAC) capsule 10 mg  10 mg Oral Daily Cindee Lame, MD   10 mg at 01/16/18 0853  . magnesium hydroxide (MILK OF MAGNESIA) suspension 30 mL  30 mL Oral Daily PRN Clapacs, John T, MD      . OLANZapine (ZYPREXA) tablet 10 mg  10 mg  Oral Q6H PRN Clapacs, Jackquline Denmark, MD      . traZODone (DESYREL) tablet 100 mg  100 mg Oral QHS PRN Clapacs, Jackquline Denmark, MD   100 mg at 01/15/18 2128  . ziprasidone (GEODON) injection 20 mg  20 mg Intramuscular Q6H PRN Clapacs, Jackquline Denmark, MD        Lab Results:  Results for orders placed or performed during the hospital encounter of 01/14/18 (from the past 48 hour(s))  Hemoglobin A1c     Status: Abnormal   Collection Time: 01/15/18  7:05 AM  Result Value Ref Range   Hgb A1c MFr Bld 4.7 (L) 4.8 - 5.6 %    Comment: (NOTE) Pre diabetes:          5.7%-6.4% Diabetes:              >6.4% Glycemic control for   <7.0% adults with diabetes    Mean Plasma Glucose 88.19 mg/dL    Comment: Performed at [facility], [address] [place], [place] [address]  Lipid panel     Status: None   Collection Time: 01/15/18  7:05 AM  Result Value Ref Range    Cholesterol 156 0 - 169 mg/dL   Triglycerides 604 <540 mg/dL   HDL 53 >98 mg/dL   Total CHOL/HDL Ratio 2.9 RATIO   VLDL 25 0 - 40 mg/dL   LDL Cholesterol 78 0 - 99 mg/dL    Comment:        Total Cholesterol/HDL:CHD Risk Coronary Heart Disease Risk Table                     Men   Women  1/2 Average Risk   3.4   3.3  Average Risk       5.0   4.4  2 X Average Risk   9.6   7.1  3 X Average Risk  23.4   11.0        Use the calculated Patient Ratio above and the CHD Risk Table to determine the patient's CHD Risk.        ATP III CLASSIFICATION (LDL):  <100     mg/dL   Optimal  119-147  mg/dL   Near or Above                    Optimal  130-159  mg/dL   Borderline  829-562  mg/dL   High  >130     mg/dL   Very High Performed at [facility], [address] [place], [place] [address]   TSH     Status: None   Collection Time: 01/15/18  7:05 AM  Result Value Ref Range   TSH 1.228 0.350 - 4.500 uIU/mL    Comment: Performed by a 3rd Generation assay with a functional sensitivity of <=0.01 uIU/mL. Performed at [facility], [address] [place], [place] [address]     Blood Alcohol level:  Lab Results  Component Value Date   ETH 105 (H) 01/13/2018    Metabolic Disorder Labs: Lab Results  Component Value Date   HGBA1C 4.7 (L) 01/15/2018   MPG 88.19 01/15/2018   No results found for: PROLACTIN Lab Results  Component Value Date   CHOL 156 01/15/2018   TRIG 123 01/15/2018   HDL 53 01/15/2018   CHOLHDL 2.9 01/15/2018   VLDL 25 01/15/2018   LDLCALC 78 01/15/2018    Physical Findings: AIMS:  , ,  ,  ,  CIWA:  CIWA-Ar Total: 0 COWS:     Musculoskeletal: Strength & Muscle Tone: within normal limits Gait & Station: normal Patient leans: N/A  Psychiatric Specialty Exam: Physical Exam  Constitutional: She is oriented to person, place, and time. She appears well-developed. She appears distressed.  Neurological: She is alert and oriented to person,  place, and time.    Review of Systems  Constitutional: Negative.   Respiratory: Negative.   Cardiovascular: Negative.   Genitourinary: Negative.     Blood pressure 130/82, pulse (!) 121, temperature 98.5 F (36.9 C), temperature source Oral, resp. rate 18, height 5' (1.524 m), weight 56.7 kg (125 lb), SpO2 100 %.Body mass index is 24.41 kg/m.  General Appearance: Casual  Eye Contact:  Good  Speech:  Clear and Coherent  Volume:  Normal  Mood:  Dysphoric  Affect:  Appropriate  Thought Process:  Coherent  Orientation:  Full (Time, Place, and Person)  Thought Content:  Logical  Suicidal Thoughts:  No  Homicidal Thoughts:  No  Memory:  Immediate;   Fair Recent;   Fair Remote;   Fair  Judgement:  Impaired  Insight:  Lacking  Psychomotor Activity:  Normal  Concentration:  Concentration: Fair and Attention Span: Fair  Recall:  Good  Fund of Knowledge:  Fair  Language:  Good  Akathisia:  No  Handed:  Right  AIMS (if indicated):     Assets:  Leisure Time Physical Health  ADL's:  Intact  Cognition:  WNL  Sleep:  Number of Hours: 10   Treatment Plan Summary: Daily contact with patient to assess and evaluate symptoms and progress in treatment and Medication management   17 yo with mood disorder and substance abuse (alcohol, marijuana) presenting with suicide attempt.  She has little insight to her behaviors and also difficulty acknowledging and expressing her emotional struggles.    - fluoxetine  for mood  -lipid panel, a1c, tsh normal    Physician Treatment Plan for Primary Diagnosis: Active Problems:   Benzodiazepine overdose  Long Term Goal(s): Improvement in symptoms so as ready for discharge  Short Term Goals: Ability to verbalize feelings will improve, Ability to demonstrate self-control will improve and Ability to identify and develop effective coping behaviors will improve  I certify that inpatient services furnished can reasonably be expected to  improve the patient's condition.       Cindee Lame, MD 01/16/2018, 3:05 PM

## 2018-01-16 NOTE — Plan of Care (Signed)
Pt denies ah/vh/si/hi/pain at this time. Pt verbalized, "she is feeling bettter". Pt still express that she was not trying to kill herself she just wanted to get high. Pt remains on Q 15 mins safety check. Will cont to monitor pt.  Problem: Education: Goal: Utilization of techniques to improve thought processes will improve Outcome: Progressing Goal: Knowledge of the prescribed therapeutic regimen will improve Outcome: Progressing   Problem: Activity: Goal: Interest or engagement in leisure activities will improve Outcome: Progressing   Problem: Coping: Goal: Coping ability will improve Outcome: Progressing Goal: Will verbalize feelings Outcome: Progressing   Problem: Health Behavior/Discharge Planning: Goal: Compliance with therapeutic regimen will improve Outcome: Progressing   Problem: Safety: Goal: Ability to disclose and discuss suicidal ideas will improve Outcome: Progressing Goal: Ability to identify and utilize support systems that promote safety will improve Outcome: Progressing   Problem: Self-Concept: Goal: Will verbalize positive feelings about self Outcome: Progressing   Problem: Education: Goal: Knowledge of [facility] Education information/materials will improve Outcome: Progressing   Problem: Education: Goal: Understanding of discharge needs will improve Outcome: Progressing   Problem: Health Behavior/Discharge Planning: Goal: Ability to identify changes in lifestyle to reduce recurrence of condition will improve Outcome: Progressing   Problem: Physical Regulation: Goal: Complications related to the disease process, condition or treatment will be avoided or minimized Outcome: Progressing

## 2018-01-16 NOTE — Progress Notes (Addendum)
Received Blue this AM after breakfast, she was compliant with her medications. She denied all of the psychiatric symptoms, OOB in the milieu at intervals with her peers. She has made and received several telephone calls throughout the day. No change in her status this PM.

## 2018-01-16 NOTE — [facility] Group Notes (Signed)
LCSW Group Therapy Note 01/16/2018 1:15pm  Type of Therapy and Topic: Group Therapy: Feelings Around Returning Home & Establishing a Supportive Framework and Supporting Oneself When Supports Not Available  Participation Level: Active  Description of Group:  Patients first processed thoughts and feelings about upcoming discharge. These included fears of upcoming changes, lack of change, new living environments, judgements and expectations from others and overall stigma of mental health issues. The group then discussed the definition of a supportive framework, what that looks and feels like, and how do to discern it from an unhealthy non-supportive network. The group identified different types of supports as well as what to do when your family/friends are less than helpful or unavailable  Therapeutic Goals  1. Patient will identify one healthy supportive network that they can use at discharge. 2. Patient will identify one factor of a supportive framework and how to tell it from an unhealthy network. 3. Patient able to identify one coping skill to use when they do not have positive supports from others. 4. Patient will demonstrate ability to communicate their needs through discussion and/or role plays.  Summary of Patient Progress:  The patient reported she feels "alright." Pt engaged during group session. As patients processed their anxiety about discharge and described healthy supports patient shared she is ready to be discharge. She stated "I feel better" I did not want to believe that I was depressed" Patients identified at least one self-care tool they were willing to use after discharge; take deep breath.   Therapeutic Modalities Cognitive Behavioral Therapy Motivational Interviewing   Michaelene Dutan  CUEBAS-COLON, LCSW 01/16/2018 12:08 PM

## 2018-01-17 DIAGNOSIS — F4323 Adjustment disorder with mixed anxiety and depressed mood: Principal | ICD-10-CM

## 2018-01-17 MED ORDER — FLUOXETINE HCL 10 MG PO CAPS: 10.0000 mg | ORAL_CAPSULE | Freq: Every day | ORAL | 1 refills | Status: DC

## 2018-01-17 NOTE — Progress Notes (Signed)
[facility] MD Progress Note  01/17/2018 3:56 PM Kristina Gay  MRN:  [id number] Subjective:  History was reviewed with patient. She states taht she was in [facility] 3-4 weeks ago for "my alcohol use." She states that her mother thought she had a drinking problem. She does admit that she did binge drink about 2-3 times a week. She slowed down on her drinking a lot when getting out of [facility]. On Thursday she drank "a four Loko and was in my feelings a lot." She states that she was thinking too much and went outside at her boyfriends apartment complex. She took a handful of Klonopin to "numb me from my feelings." She adamantly denies that she was trying to kill herself. She states that she "though I would just go to sleep and numb myself." She then immediately got scared "that I might die." She texted her friend ans mom to call the ambulance. She states that she got the Klonopin from a friend. She states that looking back on it she feels guilty for doing that. She realizes that she becomes a different person when she drinks. 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 She dropped out of high school in her senior year recently but is planning to re-enroll to finish her classes. Pt adamantly denies SI or any thoughts of self harm. She usually sleeps well at home but not in the hospital. She is attending all groups and socializing well with peers. She seems to have better insight into the incident today.   Principal Problem: Adjustment disorder with mixed anxiety and depressed mood Diagnosis:   Patient Active Problem List   Diagnosis Date Noted  . Benzodiazepine overdose [T42.4X1A] 01/14/2018  . Alcohol abuse [F10.10] 01/14/2018  . Mood disorder (HCC) [F39] 01/14/2018   Total Time spent with patient: 30 minutes  Past Psychiatric History: See H&p  Past Medical History: History reviewed. No pertinent past medical history. History reviewed. No pertinent surgical history. Family History:  History reviewed. No pertinent family history. Family Psychiatric  History: See H&P Social History:  Social History   Substance and Sexual Activity  Alcohol Use Not Currently     Social History   Substance and Sexual Activity  Drug Use Yes  . Types: Marijuana   Comment: occasionally    Social History   Socioeconomic History  . Marital status: Single    Spouse name: Not on file  . Number of children: Not on file  . Years of education: Not on file  . Highest education level: Not on file  Occupational History  . Not on file  Social Needs  . Financial resource strain: Not on file  . Food insecurity:    Worry: Not on file    Inability: Not on file  . Transportation needs:    Medical: Not on file    Non-medical: Not on file  Tobacco Use  . Smoking status: Never Smoker  . Smokeless tobacco: Never Used  Substance and Sexual Activity  . Alcohol use: Not Currently  . Drug use: Yes    Types: Marijuana    Comment: occasionally  . Sexual activity: Yes    Birth control/protection: Injection  Lifestyle  . Physical activity:    Days per week: Not on file    Minutes per session: Not on file  . Stress: Not on file  Relationships  . Social connections:    Talks on phone: Not on file    Gets together: Not  on file    Attends religious service: Not on file    Active member of club or organization: Not on file    Attends meetings of clubs or organizations: Not on file    Relationship status: Not on file  Other Topics Concern  . Not on file  Social History Narrative  . Not on file   Additional Social History:                         Sleep: Fair  Appetite:  Fair  Current Medications: Current Facility-Administered Medications  Medication Dose Route Frequency Provider Last Rate Last Dose  . acetaminophen (TYLENOL) tablet 650 mg  650 mg Oral Q6H PRN Clapacs, John T, MD      . alum & mag hydroxide-simeth (MAALOX/MYLANTA) 200-200-20 MG/5ML suspension 30 mL  30 mL  Oral Q4H PRN Clapacs, John T, MD      . FLUoxetine (PROZAC) capsule 10 mg  10 mg Oral Daily Cindee Lame, MD   10 mg at 01/17/18 0815  . magnesium hydroxide (MILK OF MAGNESIA) suspension 30 mL  30 mL Oral Daily PRN Clapacs, John T, MD      . OLANZapine (ZYPREXA) tablet 10 mg  10 mg Oral Q6H PRN Clapacs, John T, MD      . traZODone (DESYREL) tablet 100 mg  100 mg Oral QHS PRN Clapacs, Jackquline Denmark, MD   100 mg at 01/15/18 2128  . ziprasidone (GEODON) injection 20 mg  20 mg Intramuscular Q6H PRN Clapacs, John T, MD        Lab Results: No results found for this or any previous visit (from the past 48 hour(s)).  Blood Alcohol level:  Lab Results  Component Value Date   ETH 105 (H) 01/13/2018    Metabolic Disorder Labs: Lab Results  Component Value Date   HGBA1C 4.7 (L) 01/15/2018   MPG 88.19 01/15/2018   No results found for: PROLACTIN Lab Results  Component Value Date   CHOL 156 01/15/2018   TRIG 123 01/15/2018   HDL 53 01/15/2018   CHOLHDL 2.9 01/15/2018   VLDL 25 01/15/2018   LDLCALC 78 01/15/2018    Physical Findings: AIMS:  , ,  ,  ,    CIWA:  CIWA-Ar Total: 0 COWS:     Musculoskeletal: Strength & Muscle Tone: within normal limits Gait & Station: normal Patient leans: N/A  Psychiatric Specialty Exam: Physical Exam  ROS  Blood pressure 121/84, pulse (!) 138, temperature 98.7 F (37.1 C), temperature source Oral, resp. rate 18, height 5' (1.524 m), weight 56.7 kg (125 lb), SpO2 94 %.Body mass index is 24.41 kg/m.  General Appearance: Casual  Eye Contact:  Good  Speech:  Clear and Coherent  Volume:  Normal  Mood:  Euthymic  Affect:  Appropriate  Thought Process:  Coherent and Goal Directed  Orientation:  Full (Time, Place, and Person)  Thought Content:  Logical  Suicidal Thoughts:  No  Homicidal Thoughts:  No  Memory:  Immediate;   Fair  Judgement:  Fair  Insight:  Fair  Psychomotor Activity:  Normal  Concentration:  Concentration: Fair  Recall:  [employer] of Knowledge:  Fair  Language:  Fair  Akathisia:  No      Assets:  Resilience  ADL's:  Intact  Cognition:  WNL  Sleep:  Number of Hours: 7     Treatment Plan Summary: 17 yo female admitted due to overdose while intoxicated.  She adamantly denies feeling suicidal and mood is improved. She is showing better insight into the incident. She is future oriented and plans to start a new job on Wednesday.   Plan:  Adjustment disorder -Continue Prozac 10 mg daily  Dispo -Likely discharge tomorrow. CSW contacted mom who is okay with this plan Haskell Riling, MD 01/17/2018, 3:56 PM

## 2018-01-17 NOTE — Tx Team (Addendum)
Interdisciplinary Treatment and Diagnostic Plan Update  01/17/2018 Time of Session: 11:15am Kristina Gay MRN: [id number]  Principal Diagnosis: <principal problem not specified>  Secondary Diagnoses: Active Problems:   Benzodiazepine overdose   Current Medications:  Current Facility-Administered Medications  Medication Dose Route Frequency Provider Last Rate Last Dose  . acetaminophen (TYLENOL) tablet 650 mg  650 mg Oral Q6H PRN Clapacs, John T, MD      . alum & mag hydroxide-simeth (MAALOX/MYLANTA) 200-200-20 MG/5ML suspension 30 mL  30 mL Oral Q4H PRN Clapacs, John T, MD      . FLUoxetine (PROZAC) capsule 10 mg  10 mg Oral Daily Cindee Lame, MD   10 mg at 01/17/18 0815  . magnesium hydroxide (MILK OF MAGNESIA) suspension 30 mL  30 mL Oral Daily PRN Clapacs, John T, MD      . OLANZapine (ZYPREXA) tablet 10 mg  10 mg Oral Q6H PRN Clapacs, John T, MD      . traZODone (DESYREL) tablet 100 mg  100 mg Oral QHS PRN Clapacs, Jackquline Denmark, MD   100 mg at 01/15/18 2128  . ziprasidone (GEODON) injection 20 mg  20 mg Intramuscular Q6H PRN Clapacs, Jackquline Denmark, MD       PTA Medications: No medications prior to admission.    Patient Stressors: Educational concerns Loss of parent: dad in jail Substance abuse  Patient Strengths: Ability for Information systems manager fund of knowledge Supportive family/friends  Treatment Modalities: Medication Management, Group therapy, Case management,  1 to 1 session with clinician, Psychoeducation, Recreational therapy.   Physician Treatment Plan for Primary Diagnosis: <principal problem not specified> Long Term Goal(s): Improvement in symptoms so as ready for discharge   Short Term Goals: Ability to verbalize feelings will improve Ability to demonstrate self-control will improve Ability to identify and develop effective coping behaviors will improve  Medication Management: Evaluate patient's response, side effects, and tolerance of  medication regimen.  Therapeutic Interventions: 1 to 1 sessions, Unit Group sessions and Medication administration.  Evaluation of Outcomes: Progressing  Physician Treatment Plan for Secondary Diagnosis: Active Problems:   Benzodiazepine overdose  Long Term Goal(s): Improvement in symptoms so as ready for discharge   Short Term Goals: Ability to verbalize feelings will improve Ability to demonstrate self-control will improve Ability to identify and develop effective coping behaviors will improve     Medication Management: Evaluate patient's response, side effects, and tolerance of medication regimen.  Therapeutic Interventions: 1 to 1 sessions, Unit Group sessions and Medication administration.  Evaluation of Outcomes: Progressing   RN Treatment Plan for Primary Diagnosis: <principal problem not specified> Long Term Goal(s): Knowledge of disease and therapeutic regimen to maintain health will improve  Short Term Goals: Ability to remain free from injury will improve, Ability to disclose and discuss suicidal ideas and Ability to identify and develop effective coping behaviors will improve  Medication Management: RN will administer medications as ordered by provider, will assess and evaluate patient's response and provide education to patient for prescribed medication. RN will report any adverse and/or side effects to prescribing provider.  Therapeutic Interventions: 1 on 1 counseling sessions, Psychoeducation, Medication administration, Evaluate responses to treatment, Monitor vital signs and CBGs as ordered, Perform/monitor CIWA, COWS, AIMS and Fall Risk screenings as ordered, Perform wound care treatments as ordered.  Evaluation of Outcomes: Progressing   LCSW Treatment Plan for Primary Diagnosis: <principal problem not specified> Long Term Goal(s): Safe transition to appropriate next level of care at discharge, Engage patient in therapeutic group  addressing interpersonal  concerns.  Short Term Goals: Engage patient in aftercare planning with referrals and resources, Identify triggers associated with mental health/substance abuse issues and Increase skills for wellness and recovery  Therapeutic Interventions: Assess for all discharge needs, 1 to 1 time with Social worker, Explore available resources and support systems, Assess for adequacy in community support network, Educate family and significant other(s) on suicide prevention, Complete Psychosocial Assessment, Interpersonal group therapy.  Evaluation of Outcomes: Progressing   Progress in Treatment: Attending groups: Yes. Participating in groups: Yes. Taking medication as prescribed: Yes. Toleration medication: Yes. Family/Significant other contact made: Yes, individual(s) contacted:    Patient understands diagnosis: Yes. Discussing patient identified problems/goals with staff: Yes. Medical problems stabilized or resolved: Yes. Denies suicidal/homicidal ideation: Yes. Issues/concerns per patient self-inventory: Yes. Other:     New problem(s) identified: Yes, Describe:     New Short Term/Long Term Goal(s): To get feeling better and get back into school.  Discharge Plan or Barriers: discharge home with mom and follow up at [facility].  Reason for Continuation of Hospitalization: Anxiety Depression Medication stabilization Suicidal ideation Other; describe Coordination of Aftercare  Estimated Length of Stay: 1-2 days  Recreational Therapy: Patient Stressors: PTSD  Patient Goal: Patient will successfully identify 2 ways of making healthy decisions post d/c within 5 recreation therapy group sessions  Attendees: Patient:Kristina Gay 01/17/2018 3:56 PM  Physician: Corinna Gab, MD 01/17/2018 3:56 PM  Nursing: Milas Hock, RN 01/17/2018 3:56 PM  RN Care Manager: 01/17/2018 3:56 PM  Social Worker: Jake Shark, LCSW 01/17/2018 3:56 PM  Recreational Therapist: Garret Reddish, LRT 01/17/2018 3:56 PM   Other: Heidi Dach, LCSW 01/17/2018 3:56 PM  Other:  01/17/2018 3:56 PM  Other: 01/17/2018 3:56 PM    Scribe for Treatment Team: Glennon Mac, LCSW 01/17/2018 3:56 PM

## 2018-01-17 NOTE — Progress Notes (Addendum)
Recreation Therapy Notes  INPATIENT RECREATION THERAPY ASSESSMENT  Patient Details Name: Kristina Gay MRN: [id number] DOB: [birth year] Today's Date: 01/17/2018  Patient states she has been drinking since an incident has happened to hershe is very vague about this incident but says she has been having some PTSD from the incident and just drinks to cope. Patient also states she has a pending DWI charge and that she missed court today (01/17/2018).       Information Obtained From: Patient  Able to Participate in Assessment/Interview: Yes  Patient Presentation: Responsive  Reason for Admission (Per Patient): Substance Abuse  Patient Stressors: Other (Comment)(PTSD)  Coping Skills:   Deep Breathing, Substance Abuse, Other (Comment)(Working)  Leisure Interests (2+):  Sports - Basketball, Exercise - Walking(Work)  Frequency of Recreation/Participation: Weekly  Awareness of Community Resources:  Yes  Community Resources:  Gym  Current Use:    If no, Barriers?:    Expressed Interest in [employer] Information:    [place] of Residence:  Film/video editor  Patient Main Form of Transportation: Other (Comment)(Friends and family take me around)  Patient Strengths:  Determination, reliable, dependable, loyalty, trustworthy  Patient Identified Areas of Improvement:  Getting back in school  Patient Goal for Hospitalization:  Staying positive  Current SI (including self-harm):  No  Current HI:  No  Current AVH: No  Staff Intervention Plan: Group Attendance, Collaborate with Interdisciplinary Treatment Team  Consent to Intern Participation: N/A  Zion Ta 01/17/2018, 1:35 PM

## 2018-01-17 NOTE — Progress Notes (Signed)
Recreation Therapy Notes  Date: 01/17/2018  Time: 9:30 am  Location: Craft Room  Behavioral response: Appropriate  Intervention Topic: Coping Skills  Discussion/Intervention:  Group content on today was focused on coping skills. The group defined what coping skills are and when they can be used. Individuals described how they normally cope with thing and the coping skills they normally use. Patients expressed why it is important to cope with things and how not coping with things can affect you. The group participated in the intervention "My coping box" and made coping boxes while adding coping skills they could use in the future to the box. Clinical Observations/Feedback:  Patient came to group and was focused on what her peers and staff had to say about coping skills. Individual participated in the intervention and was social with peers and staff during group.  Zaliyah Meikle LRT/CTRS         Braelin Costlow 01/17/2018 12:07 PM

## 2018-01-17 NOTE — [facility] Suicide Risk Assessment (Signed)
[facility] INPATIENT:  Family/Significant Other Suicide Prevention Education  Suicide Prevention Education:  Education Completed; Dava Najjar, Mother,  (name of family member/significant other) has been identified by the patient as the family member/significant other with whom the patient will be residing, and identified as the person(s) who will aid the patient in the event of a mental health crisis (suicidal ideations/suicide attempt).  With written consent from the patient, the family member/significant other has been provided the following suicide prevention education, prior to the and/or following the discharge of the patient.  The suicide prevention education provided includes the following:  Suicide risk factors  Suicide prevention and interventions  National Suicide Hotline telephone number  [facility] assessment telephone number  [facility] Emergency Assistance 911  [facility] and/or Residential Mobile Crisis Unit telephone number  Request made of family/significant other to:  Remove weapons (e.g., guns, rifles, knives), all items previously/currently identified as safety concern.    Remove drugs/medications (over-the-counter, prescriptions, illicit drugs), all items previously/currently identified as a safety concern.  The family member/significant other verbalizes understanding of the suicide prevention education information provided.  The family member/significant other agrees to remove the items of safety concern listed above.  Cleda Daub Serrina [place]ogue, LCSW 01/17/2018, 3:17 PM

## 2018-01-18 NOTE — Progress Notes (Signed)
Recreation Therapy Notes   Date: 01/18/2018  Time: 9:30 am  Location: Craft Room  Behavioral response: Appropriate  Intervention Topic: Communication  Discussion/Intervention:  Group content today was focused on communication. The group defined communication and ways to communicate with others. Individuals stated reason why communication is important and some reasons to communicate with others. Patients expressed if they thought they were good at communicating with others and ways they could improve their communication skills. The group identified important parts of communication and some experiences they have had in the past with communication. The group participated in the intervention "What is that?", where they had a chance to test out their communication skills and identify ways to improve their communication techniques.  Clinical Observations/Feedback:  Patient came to group and stated that body language can mean different things when communicating with others. She stated body language can give off mixed signals.Individual was social with peers and staff during group.  Akiem Urieta LRT/CTRS          Tysheem Accardo 01/18/2018 12:02 PM

## 2018-01-18 NOTE — Progress Notes (Signed)
Recreation Therapy Notes  INPATIENT RECREATION TR PLAN  Patient Details Name: Kristina Gay MRN: [id number] DOB: 03/18/2001 Today's Date: 01/18/2018  Rec Therapy Plan Is patient appropriate for Therapeutic Recreation?: Yes Treatment times per week: at least 3 Estimated Length of Stay: 5-7 days TR Treatment/Interventions: Group participation (Comment)  Discharge Criteria Pt will be discharged from therapy if:: Discharged Treatment plan/goals/alternatives discussed and agreed upon by:: Patient/family  Discharge Summary Short term goals set: Patient will successfully identify 2 ways of making healthy decisions post d/c within 5 recreation therapy group sessions Progress toward goals comments: Groups attended Which groups?: Coping skills, Communication Reason goals not met: N/A Therapeutic equipment acquired: N/A Reason patient discharged from therapy: Discharge from hospital Pt/family agrees with progress & goals achieved: Yes Date patient discharged from therapy: 01/18/18   Kristina Gay 01/18/2018, 12:07 PM

## 2018-01-18 NOTE — Discharge Summary (Signed)
Physician Discharge Summary Note  Patient:  Kristina Gay is an 17 y.o., female MRN:  [id number] DOB:  07-Jul-2001 Patient phone:  [phone] (home)  Patient address:   [place] [address],  Total Time spent with patient: 15 minutes  Plus 20 minutes of medication reconciliation, discharge planning, and discharge documentation   Date of Admission:  01/14/2018 Date of Discharge: 01/18/18  Reason for Admission:  Overdose  Principal Problem: Adjustment disorder with mixed anxiety and depressed mood Discharge Diagnoses: Patient Active Problem List   Diagnosis Date Noted  . Adjustment disorder with mixed anxiety and depressed mood [F43.23] 01/17/2018    Priority: High  . Benzodiazepine overdose [T42.4X1A] 01/14/2018  . Alcohol abuse [F10.10] 01/14/2018  . Mood disorder ([place]) [F39] 01/14/2018    Past Psychiatric History: See H&P  Past Medical History: History reviewed. No pertinent past medical history. History reviewed. No pertinent surgical history. Family History: History reviewed. No pertinent family history. Family Psychiatric  History: See H&P Social History:  Social History   Substance and Sexual Activity  Alcohol Use Not Currently     Social History   Substance and Sexual Activity  Drug Use Yes  . Types: Marijuana   Comment: occasionally    Social History   Socioeconomic History  . Marital status: Single    Spouse name: Not on file  . Number of children: Not on file  . Years of education: Not on file  . Highest education level: Not on file  Occupational History  . Not on file  Social Needs  . Financial resource strain: Not on file  . Food insecurity:    Worry: Not on file    Inability: Not on file  . Transportation needs:    Medical: Not on file    Non-medical: Not on file  Tobacco Use  . Smoking status: Never Smoker  . Smokeless tobacco: Never Used  Substance and Sexual Activity  . Alcohol use: Not Currently  . Drug use: Yes     Types: Marijuana    Comment: occasionally  . Sexual activity: Yes    Birth control/protection: Injection  Lifestyle  . Physical activity:    Days per week: Not on file    Minutes per session: Not on file  . Stress: Not on file  Relationships  . Social connections:    Talks on phone: Not on file    Gets together: Not on file    Attends religious service: Not on file    Active member of club or organization: Not on file    Attends meetings of clubs or organizations: Not on file    Relationship status: Not on file  Other Topics Concern  . Not on file  Social History Narrative  . Not on file    Hospital Course:  Pt was started on Prozac for depression. Pt attended all groups during her stay in the hospital. She was social on the unit. On day of discharge, she had bright affect. She was looking forward to discharge. She is starting a job Architectural technologist. She plans to follow up with [facility] and met with Lanae Boast to fill out paperwork. She consistently denied SI or any thoughts of self harm. Denied AH, VH. She did not appear manic or psychotic.   The patient is at low risk of imminent suicide. Patient denied thoughts, intent, or plan for harm to self or others, expressed significant future orientation, and expressed an ability to mobilize assistance for her needs. She is  presently void of any contributing psychiatric symptoms, cognitive difficulties, or substance use which would elevate her risk for lethality. Chronic risk for lethality is elevated in light of impulsivity, alcohol use, cluster b traits. The chronic risk is presently mitigated by her ongoing desire and engagement in [facility] treatment and mobilization of support from family and friends. Chronic risk may elevate if she experiences any significant loss or worsening of symptoms, which can be managed and monitored through outpatient providers. At this time,a cute risk for lethality is low and she is stable for ongoing outpatient management.    Modifiable risk factors were addressed during this hospitalization through appropriate pharmacotherapy and establishment of outpatient follow-up treatment. Some risk factors for suicide are situational (i.e. Unstable housing) or related personality pathology (i.e. Poor coping mechanisms) and thus cannot be further mitigated by continued hospitalization in this setting.    Physical Findings: AIMS:  , ,  ,  ,    CIWA:  CIWA-Ar Total: 0 COWS:     Musculoskeletal: Strength & Muscle Tone: within normal limits Gait & Station: normal Patient leans: N/A  Psychiatric Specialty Exam: Physical Exam  Nursing note and vitals reviewed.   Review of Systems  All other systems reviewed and are negative.   Blood pressure 112/76, pulse (!) 138, temperature 98 F (36.7 C), temperature source Oral, resp. rate 16, height 5' (1.524 m), weight 56.7 kg (125 lb), SpO2 100 %.Body mass index is 24.41 kg/m.  General Appearance: Casual  Eye Contact:  Fair  Speech:  Clear and Coherent  Volume:  Normal  Mood:  Euthymic  Affect:  Congruent  Thought Process:  Coherent and Goal Directed  Orientation:  Full (Time, Place, and Person)  Thought Content:  Logical  Suicidal Thoughts:  No  Homicidal Thoughts:  No  Memory:  Immediate;   Fair  Judgement:  Fair  Insight:  Fair  Psychomotor Activity:  Normal  Concentration:  Concentration: Fair  Recall:  [place] of Knowledge:  Fair  Language:  Fair  Akathisia:  No      Assets:  Resilience  ADL's:  Intact  Cognition:  WNL  Sleep:  Number of Hours: 4     Have you used any form of tobacco in the last 30 days? (Cigarettes, Smokeless Tobacco, Cigars, and/or Pipes): No  Has this patient used any form of tobacco in the last 30 days? (Cigarettes, Smokeless Tobacco, Cigars, and/or Pipes) No  Blood Alcohol level:  Lab Results  Component Value Date   ETH 105 (H) 75/06/2584    Metabolic Disorder Labs:  Lab Results  Component Value Date   HGBA1C 4.7 (L)  01/15/2018   MPG 88.19 01/15/2018   No results found for: PROLACTIN Lab Results  Component Value Date   CHOL 156 01/15/2018   TRIG 123 01/15/2018   HDL 53 01/15/2018   CHOLHDL 2.9 01/15/2018   VLDL 25 01/15/2018   LDLCALC 78 01/15/2018    See Psychiatric Specialty Exam and Suicide Risk Assessment completed by Attending Physician prior to discharge.  Discharge destination:  Home  Is patient on multiple antipsychotic therapies at discharge:  No   Has Patient had three or more failed trials of antipsychotic monotherapy by history:  No  Recommended Plan for Multiple Antipsychotic Therapies: NA  Discharge Instructions    Increase activity slowly   Complete by:  As directed      Allergies as of 01/18/2018   No Known Allergies     Medication List  TAKE these medications     Indication  FLUoxetine 10 MG capsule Commonly known as:  PROZAC Take 1 capsule (10 mg total) by mouth daily.  Indication:  Depression      Follow-up Information    [facility] on 01/19/2018.   Why:  7:15am for Hospital Follow up and Peer services with Sherrian Divers, Peer Support Specialist at [facility] [phone] Contact information: [place] [address] [phone]           Follow-up recommendations: [facility] Signed: Marylin Crosby, MD 01/18/2018, 9:15 AM

## 2018-01-18 NOTE — Plan of Care (Signed)
Patient slept for Estimated Hours of 4.0; Precautionary checks every 15 minutes for safety maintained, room free of safety hazards, patient sustains no injury or falls during this shift.  Problem: Education: Goal: Utilization of techniques to improve thought processes will improve Outcome: Progressing Goal: Knowledge of the prescribed therapeutic regimen will improve Outcome: Progressing   Problem: Activity: Goal: Interest or engagement in leisure activities will improve Outcome: Progressing   Problem: Coping: Goal: Coping ability will improve Outcome: Progressing Goal: Will verbalize feelings Outcome: Progressing   Problem: Health Behavior/Discharge Planning: Goal: Compliance with therapeutic regimen will improve Outcome: Progressing   Problem: Safety: Goal: Ability to disclose and discuss suicidal ideas will improve Outcome: Progressing Goal: Ability to identify and utilize support systems that promote safety will improve Outcome: Progressing   Problem: Self-Concept: Goal: Will verbalize positive feelings about self Outcome: Progressing   Problem: Education: Goal: Knowledge of [facility] Education information/materials will improve Outcome: Progressing   Problem: Education: Goal: Understanding of discharge needs will improve Outcome: Progressing   Problem: Health Behavior/Discharge Planning: Goal: Ability to identify changes in lifestyle to reduce recurrence of condition will improve Outcome: Progressing   Problem: Physical Regulation: Goal: Complications related to the disease process, condition or treatment will be avoided or minimized Outcome: Progressing

## 2018-01-18 NOTE — [facility] Group Notes (Signed)
CSW Group Therapy Note  01/18/2018  Time:  0900  Type of Therapy and Topic: Group Therapy: Goals Group: SMART Goals    Participation Level:  Did Not Attend    Description of Group:   The purpose of a daily goals group is to assist and guide patients in setting recovery/wellness-related goals. The objective is to set goals as they relate to the crisis in which they were admitted. Patients will be using SMART goal modalities to set measurable goals. Characteristics of realistic goals will be discussed and patients will be assisted in setting and processing how one will reach their goal. Facilitator will also assist patients in applying interventions and coping skills learned in psycho-education groups to the SMART goal and process how one will achieve defined goal.    Therapeutic Goals:  -Patients will develop and document one goal related to or their crisis in which brought them into treatment.  -Patients will be guided by LCSW using SMART goal setting modality in how to set a measurable, attainable, realistic and time sensitive goal.  -Patients will process barriers in reaching goal.  -Patients will process interventions in how to overcome and successful in reaching goal.    Patient's Goal:  Pt was invited to attend group but chose not to attend. CSW will continue to encourage pt to attend group throughout their admission.     Therapeutic Modalities:  Motivational Interviewing  Cognitive Behavioral Therapy  Crisis Intervention Model  SMART goals setting  Heidi Dach, MSW, LCSW Clinical Social Worker 01/18/2018 9:37 AM

## 2018-01-18 NOTE — [facility] Suicide Risk Assessment (Signed)
[facility] Discharge Suicide Risk Assessment   Principal Problem: Adjustment disorder with mixed anxiety and depressed mood Discharge Diagnoses:  Patient Active Problem List   Diagnosis Date Noted  . Adjustment disorder with mixed anxiety and depressed mood [F43.23] 01/17/2018    Priority: High  . Benzodiazepine overdose [T42.4X1A] 01/14/2018  . Alcohol abuse [F10.10] 01/14/2018  . Mood disorder ([facility]) [F39] 01/14/2018    Mental Status Per Nursing Assessment::   On Admission:     Demographic Factors:  NA  Loss Factors: NA  Historical Factors: Impulsivity  Risk Reduction Factors:   Living with another person, especially a relative, Positive social support, Positive therapeutic relationship and Positive coping skills or problem solving skills  Continued Clinical Symptoms:  None  Cognitive Features That Contribute To Risk:  None    Suicide Risk:  Minimal: No identifiable suicidal ideation.    Follow-up Information    [facility] Go on 01/19/2018.   Why:  7:15am for Hospital Follow up and Peer services with Unk Pinto, Peer Support Specialist at [facility] [phone] Contact information: [address] [place] [address] [phone]           Plan Of Care/Follow-up recommendations: Follow up with [facility] Haskell Riling, MD 01/18/2018, 9:14 AM

## 2018-01-18 NOTE — Progress Notes (Signed)
  [facility] Adult Case Management Discharge Plan :  Will you be returning to the same living situation after discharge:  Yes,  Home with family At discharge, do you have transportation home?: Yes,  Family member coming at discharge Do you have the ability to pay for your medications: Yes,  Referred to a provider who can assist  Release of information consent forms completed and in the chart;  Patient's signature needed at discharge.  Patient to Follow up at: Follow-up Information    [facility] Go on 01/19/2018.   Why:  7:15am for Hospital Follow up and Peer services with Unk Pinto, Peer Support Specialist at [facility] [phone] Contact information: [address] [place] [address] [phone]           Next level of care provider has access to [facility] Link:no  Safety Planning and Suicide Prevention discussed: Yes,  Dava Najjar, Mother  Have you used any form of tobacco in the last 30 days? (Cigarettes, Smokeless Tobacco, Cigars, and/or Pipes): No  Has patient been referred to the Quitline?: N/A patient is not a smoker  Patient has been referred for addiction treatment: Yes  Johny Shears, LCSW 01/18/2018, 10:01 AM

## 2018-01-18 NOTE — Progress Notes (Signed)
Patient ID: Kristina Gay, female   DOB: [birth year], 17 y.o.   MRN: [id number] Pleasant on approach, jovial, upbeat, interacting well with peers, mood and affect appropriate to situation, asymptomatic of depressive symptoms, remorseful about her ordeal, denied SI/HI/AVH; unable to sleep and received  Trazodone 100 mg at 0102; will monitor.

## 2018-03-04 ENCOUNTER — Other Ambulatory Visit: Payer: Self-pay

## 2018-03-04 ENCOUNTER — Emergency Department: Payer: Self-pay

## 2018-03-04 ENCOUNTER — Emergency Department
Admission: EM | Admit: 2018-03-04 | Discharge: 2018-03-04 | Disposition: A | Discharge status: Home / Self Care | Payer: Self-pay | Attending: Emergency Medicine | Admitting: Emergency Medicine

## 2018-03-04 DIAGNOSIS — F329 Major depressive disorder, single episode, unspecified: Secondary | ICD-10-CM | POA: Insufficient documentation

## 2018-03-04 DIAGNOSIS — F1994 Other psychoactive substance use, unspecified with psychoactive substance-induced mood disorder: Secondary | ICD-10-CM

## 2018-03-04 DIAGNOSIS — F1010 Alcohol abuse, uncomplicated: Secondary | ICD-10-CM | POA: Diagnosis present

## 2018-03-04 DIAGNOSIS — F10921 Alcohol use, unspecified with intoxication delirium: Secondary | ICD-10-CM | POA: Insufficient documentation

## 2018-03-04 DIAGNOSIS — Z79899 Other long term (current) drug therapy: Secondary | ICD-10-CM | POA: Insufficient documentation

## 2018-03-04 DIAGNOSIS — F4323 Adjustment disorder with mixed anxiety and depressed mood: Secondary | ICD-10-CM | POA: Diagnosis present

## 2018-03-04 DIAGNOSIS — F1021 Alcohol dependence, in remission: Secondary | ICD-10-CM | POA: Diagnosis present

## 2018-03-04 DIAGNOSIS — Y908 Blood alcohol level of 240 mg/100 ml or more: Secondary | ICD-10-CM | POA: Insufficient documentation

## 2018-03-04 LAB — COMPREHENSIVE METABOLIC PANEL
ALBUMIN: 4.7 g/dL (ref 3.5–5.0)
ALK PHOS: 83 U/L (ref 38–126)
ALT: 17 U/L (ref 14–54)
ANION GAP: 14 (ref 5–15)
AST: 36 U/L (ref 15–41)
BILIRUBIN TOTAL: 0.4 mg/dL (ref 0.3–1.2)
BUN: 9 mg/dL (ref 6–20)
CALCIUM: 9.1 mg/dL (ref 8.9–10.3)
CO2: 18 mmol/L — AB (ref 22–32)
Chloride: 108 mmol/L (ref 101–111)
Creatinine, Ser: 0.96 mg/dL (ref 0.44–1.00)
GFR calc Af Amer: >60 mL/min (ref >60)
GFR calc non Af Amer: >60 mL/min (ref >60)
Glucose, Bld: 127 mg/dL — ABNORMAL HIGH (ref 65–99)
Potassium: 2.8 mmol/L — ABNORMAL LOW (ref 3.5–5.1)
SODIUM: 140 mmol/L (ref 135–145)
TOTAL PROTEIN: 8.2 g/dL — AB (ref 6.5–8.1)

## 2018-03-04 LAB — URINALYSIS, COMPLETE (UACMP) WITH MICROSCOPIC
Bilirubin Urine: NEGATIVE
GLUCOSE, UA: NEGATIVE mg/dL
KETONES UR: NEGATIVE mg/dL
Leukocytes, UA: NEGATIVE
NITRITE: NEGATIVE
PROTEIN: 100 mg/dL — AB
Specific Gravity, Urine: 1.011 (ref 1.005–1.030)
pH: 6 (ref 5.0–8.0)

## 2018-03-04 LAB — CBC
HEMATOCRIT: 41.2 % (ref 35.0–47.0)
HEMOGLOBIN: 14.2 g/dL (ref 12.0–16.0)
MCH: 31.5 pg (ref 26.0–34.0)
MCHC: 34.6 g/dL (ref 32.0–36.0)
MCV: 91 fL (ref 80.0–100.0)
Platelets: 386 10*3/uL (ref 150–440)
RBC: 4.53 MIL/uL (ref 3.80–5.20)
RDW: 12.8 % (ref 11.5–14.5)
WBC: 13.1 10*3/uL — ABNORMAL HIGH (ref 3.6–11.0)

## 2018-03-04 LAB — URINE DRUG SCREEN, QUALITATIVE (ARMC ONLY)
AMPHETAMINES, UR SCREEN: NOT DETECTED
BENZODIAZEPINE, UR SCRN: POSITIVE — AB
CANNABINOID 50 NG, UR ~~LOC~~: POSITIVE — AB
Cocaine Metabolite,Ur ~~LOC~~: POSITIVE — AB
MDMA (Ecstasy)Ur Screen: NOT DETECTED
Methadone Scn, Ur: NOT DETECTED
Opiate, Ur Screen: NOT DETECTED
PHENCYCLIDINE (PCP) UR S: NOT DETECTED
TRICYCLIC, UR SCREEN: NOT DETECTED

## 2018-03-04 LAB — ETHANOL: Alcohol, Ethyl (B): 305 mg/dL (ref <10)

## 2018-03-04 MED ORDER — HALOPERIDOL LACTATE 5 MG/ML IJ SOLN
INTRAMUSCULAR | Status: AC
  Filled 2018-03-04: qty 1

## 2018-03-04 MED ORDER — DIPHENHYDRAMINE HCL 50 MG/ML IJ SOLN: 50.0000 mg | Freq: Once | INTRAMUSCULAR | Status: DC

## 2018-03-04 MED ORDER — DIPHENHYDRAMINE HCL 50 MG/ML IJ SOLN
INTRAMUSCULAR | Status: AC
  Administered 2018-03-04: 50 mg via INTRAMUSCULAR
  Filled 2018-03-04: qty 1

## 2018-03-04 MED ORDER — LORAZEPAM 2 MG/ML IJ SOLN: 2.0000 mg | Freq: Once | INTRAMUSCULAR | Status: DC

## 2018-03-04 MED ORDER — LORAZEPAM 2 MG/ML IJ SOLN
INTRAMUSCULAR | Status: AC
  Administered 2018-03-04: 2 mg via INTRAMUSCULAR
  Filled 2018-03-04: qty 1

## 2018-03-04 MED ORDER — HALOPERIDOL LACTATE 5 MG/ML IJ SOLN
5.0000 mg | Freq: Once | INTRAMUSCULAR | Status: AC
  Administered 2018-03-04: 5 mg via INTRAMUSCULAR

## 2018-03-04 NOTE — ED Triage Notes (Signed)
[place] [place] [facility] [place] [place] while [place]. Pt seems to be intoxicated and is noncooperative at this time. Pt is under IVC hx of depression and substance abuse.

## 2018-03-04 NOTE — ED Notes (Signed)
Hourly rounding reveals patient back in room from CT. No complaints, stable, in no acute distress. Q15 minute rounds and monitoring via Psychologist, counsellingover and Officer to continue.

## 2018-03-04 NOTE — ED Notes (Addendum)
Pulse rate elevated (136).  EDP made aware. Will re check in a few minutes. Maintained on 15 minute checks and observation by security camera for safety.
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
## 2018-03-04 NOTE — BH Assessment (Signed)
This Clinical research associatewriter called patient's mother Gerre Scullshlee Copelan @ [phone] and informed patient is being discharged from [facility]. Mother stated she will pick up patient soon.
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
## 2018-03-04 NOTE — ED Notes (Signed)
Pt sleeping. Breakfast tray left at pt bedside.

## 2018-03-04 NOTE — ED Notes (Signed)
Dr. Manson PasseyBrown made aware of ETOH 305.

## 2018-03-04 NOTE — ED Provider Notes (Signed)
Cleared for discharge by Dr. Clapacs   Akansha Wyche, MD 03/04/18 1514  

## 2018-03-04 NOTE — BH Assessment (Signed)
Writer attempted to assess pt, pt asleep at this time.

## 2018-03-04 NOTE — ED Notes (Signed)
IVC rescinded/ Consult completed/ not dictated yet

## 2018-03-04 NOTE — ED Notes (Signed)
Patient resting quietly in room. No noted distress or abnormal behaviors noted. Will continue 15 minute checks and observation by security camera for safety. 

## 2018-03-04 NOTE — ED Notes (Signed)
Pt. To CT
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
## 2018-03-04 NOTE — BH Assessment (Signed)
Per Dr. Shary Keylapac's patient doesn't meet criteria for inpatient psychiatric treatment and can be d/c home.

## 2018-03-04 NOTE — ED Notes (Signed)
Pulse re checked (125).  EDP told this writer to continue discharge process.

## 2018-03-04 NOTE — ED Notes (Signed)
Pt had a visit with her mother. Pt tearful, but calm.   Dr. Toni Amendlapacs is speaking with patient now.   Maintained on 15 minute checks and observation by security camera for safety.

## 2020-02-29 ENCOUNTER — Emergency Department: Payer: No Typology Code available for payment source

## 2020-02-29 ENCOUNTER — Other Ambulatory Visit: Payer: Self-pay

## 2020-02-29 ENCOUNTER — Encounter: Payer: Self-pay | Admitting: Emergency Medicine

## 2020-02-29 DIAGNOSIS — Y9389 Activity, other specified: Secondary | ICD-10-CM | POA: Insufficient documentation

## 2020-02-29 DIAGNOSIS — Y999 Unspecified external cause status: Secondary | ICD-10-CM | POA: Insufficient documentation

## 2020-02-29 DIAGNOSIS — Y92410 Unspecified street and highway as the place of occurrence of the external cause: Secondary | ICD-10-CM | POA: Insufficient documentation

## 2020-02-29 DIAGNOSIS — G44319 Acute post-traumatic headache, not intractable: Secondary | ICD-10-CM | POA: Insufficient documentation

## 2020-02-29 LAB — POCT PREGNANCY, URINE: Preg Test, Ur: NEGATIVE
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
## 2020-03-01 ENCOUNTER — Emergency Department
Admission: EM | Admit: 2020-03-01 | Discharge: 2020-03-01 | Disposition: A | Discharge status: Home / Self Care | Payer: No Typology Code available for payment source | Attending: Emergency Medicine | Admitting: Emergency Medicine

## 2020-03-01 DIAGNOSIS — G44319 Acute post-traumatic headache, not intractable: Secondary | ICD-10-CM
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
## 2021-05-11 ENCOUNTER — Encounter: Payer: Self-pay | Admitting: Emergency Medicine

## 2021-05-11 ENCOUNTER — Emergency Department
Admission: EM | Admit: 2021-05-11 | Discharge: 2021-05-11 | Disposition: A | Discharge status: Home / Self Care | Payer: Self-pay | Attending: Emergency Medicine | Admitting: Emergency Medicine

## 2021-05-11 ENCOUNTER — Emergency Department: Payer: Self-pay

## 2021-05-11 DIAGNOSIS — Z79899 Other long term (current) drug therapy: Secondary | ICD-10-CM | POA: Insufficient documentation

## 2021-05-11 DIAGNOSIS — F1910 Other psychoactive substance abuse, uncomplicated: Secondary | ICD-10-CM | POA: Insufficient documentation

## 2021-05-11 DIAGNOSIS — R109 Unspecified abdominal pain: Secondary | ICD-10-CM | POA: Insufficient documentation

## 2021-05-11 DIAGNOSIS — R1110 Vomiting, unspecified: Secondary | ICD-10-CM | POA: Insufficient documentation

## 2021-05-11 DIAGNOSIS — Y902 Blood alcohol level of 40-59 mg/100 ml: Secondary | ICD-10-CM | POA: Insufficient documentation

## 2021-05-11 DIAGNOSIS — R4182 Altered mental status, unspecified: Secondary | ICD-10-CM | POA: Insufficient documentation

## 2021-05-11 DIAGNOSIS — R000 Tachycardia, unspecified: Secondary | ICD-10-CM | POA: Insufficient documentation

## 2021-05-11 LAB — COMPREHENSIVE METABOLIC PANEL
ALT: 18 U/L (ref 0–44)
AST: 29 U/L (ref 15–41)
Albumin: 5.1 g/dL — ABNORMAL HIGH (ref 3.5–5.0)
Alkaline Phosphatase: 92 U/L (ref 38–126)
Anion gap: 15 (ref 5–15)
BUN: 15 mg/dL (ref 6–20)
CO2: 19 mmol/L — ABNORMAL LOW (ref 22–32)
Calcium: 9.4 mg/dL (ref 8.9–10.3)
Chloride: 104 mmol/L (ref 98–111)
Creatinine, Ser: 0.67 mg/dL (ref 0.44–1.00)
GFR, Estimated: >60 mL/min (ref >60)
Glucose, Bld: 82 mg/dL (ref 70–99)
Potassium: 3.2 mmol/L — ABNORMAL LOW (ref 3.5–5.1)
Sodium: 138 mmol/L (ref 135–145)
Total Bilirubin: 0.7 mg/dL (ref 0.3–1.2)
Total Protein: 8.5 g/dL — ABNORMAL HIGH (ref 6.5–8.1)

## 2021-05-11 LAB — CBC
HCT: 41 % (ref 36.0–46.0)
Hemoglobin: 13.9 g/dL (ref 12.0–15.0)
MCH: 30.1 pg (ref 26.0–34.0)
MCHC: 33.9 g/dL (ref 30.0–36.0)
MCV: 88.7 fL (ref 80.0–100.0)
Platelets: 435 10*3/uL — ABNORMAL HIGH (ref 150–400)
RBC: 4.62 MIL/uL (ref 3.87–5.11)
RDW: 11.9 % (ref 11.5–15.5)
WBC: 23.2 10*3/uL — ABNORMAL HIGH (ref 4.0–10.5)
nRBC: 0 % (ref 0.0–0.2)

## 2021-05-11 LAB — TROPONIN I (HIGH SENSITIVITY): Troponin I (High Sensitivity): 3 ng/L (ref <18)

## 2021-05-11 LAB — URINALYSIS, COMPLETE (UACMP) WITH MICROSCOPIC
Bilirubin Urine: NEGATIVE
Glucose, UA: NEGATIVE mg/dL
Hgb urine dipstick: NEGATIVE
Ketones, ur: >160 mg/dL — AB
Leukocytes,Ua: NEGATIVE
Nitrite: NEGATIVE
Protein, ur: NEGATIVE mg/dL
Specific Gravity, Urine: 1.02 (ref 1.005–1.030)
pH: 7 (ref 5.0–8.0)

## 2021-05-11 LAB — URINE DRUG SCREEN, QUALITATIVE (ARMC ONLY)
Amphetamines, Ur Screen: NOT DETECTED
Barbiturates, Ur Screen: NOT DETECTED
Benzodiazepine, Ur Scrn: NOT DETECTED
Cannabinoid 50 Ng, Ur ~~LOC~~: NOT DETECTED
Cocaine Metabolite,Ur ~~LOC~~: POSITIVE — AB
MDMA (Ecstasy)Ur Screen: NOT DETECTED
Methadone Scn, Ur: NOT DETECTED
Opiate, Ur Screen: NOT DETECTED
Phencyclidine (PCP) Ur S: NOT DETECTED
Tricyclic, Ur Screen: NOT DETECTED

## 2021-05-11 LAB — SALICYLATE LEVEL: Salicylate Lvl: <7 mg/dL — ABNORMAL LOW (ref 7.0–30.0)

## 2021-05-11 LAB — CBG MONITORING, ED: Glucose-Capillary: 82 mg/dL (ref 70–99)

## 2021-05-11 LAB — ACETAMINOPHEN LEVEL: Acetaminophen (Tylenol), Serum: <10 ug/mL — ABNORMAL LOW (ref 10–30)

## 2021-05-11 LAB — ETHANOL: Alcohol, Ethyl (B): 57 mg/dL — ABNORMAL HIGH (ref <10)

## 2021-05-11 LAB — PREGNANCY, URINE: Preg Test, Ur: NEGATIVE

## 2021-05-11 MED ORDER — SODIUM CHLORIDE 0.9 % IV BOLUS (SEPSIS)
1000.0000 mL | Freq: Once | INTRAVENOUS | Status: AC
  Administered 2021-05-11: 1000 mL via INTRAVENOUS

## 2021-05-11 MED ORDER — ONDANSETRON HCL 4 MG/2ML IJ SOLN
4.0000 mg | Freq: Once | INTRAMUSCULAR | Status: AC
  Administered 2021-05-11: 4 mg via INTRAVENOUS
  Filled 2021-05-11: qty 2
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
## 2021-05-11 NOTE — ED Triage Notes (Signed)
Pt appears intoxicated, complains of chest pain, vomiting. Pt is diaphoretic. Pt states she also has abd pain. Per mother pt may have taken crack and percocet tonight. Had to sternal rub pt awake multiple times during triage.
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
## 2021-09-03 ENCOUNTER — Inpatient Hospital Stay (HOSPITAL_COMMUNITY)
Admission: EM | Admit: 2021-09-03 | Discharge: 2021-09-05 | DRG: 536 | Disposition: A | Discharge status: Home / Self Care | Payer: No Typology Code available for payment source | Attending: General Surgery | Admitting: General Surgery

## 2021-09-03 ENCOUNTER — Emergency Department (HOSPITAL_COMMUNITY): Payer: No Typology Code available for payment source

## 2021-09-03 ENCOUNTER — Encounter (HOSPITAL_COMMUNITY): Payer: Self-pay | Admitting: Emergency Medicine

## 2021-09-03 ENCOUNTER — Other Ambulatory Visit: Payer: Self-pay

## 2021-09-03 ENCOUNTER — Inpatient Hospital Stay (HOSPITAL_COMMUNITY): Payer: No Typology Code available for payment source

## 2021-09-03 DIAGNOSIS — F1410 Cocaine abuse, uncomplicated: Secondary | ICD-10-CM | POA: Diagnosis present

## 2021-09-03 DIAGNOSIS — T1490XA Injury, unspecified, initial encounter: Secondary | ICD-10-CM

## 2021-09-03 DIAGNOSIS — Z3202 Encounter for pregnancy test, result negative: Secondary | ICD-10-CM | POA: Diagnosis present

## 2021-09-03 DIAGNOSIS — Y908 Blood alcohol level of 240 mg/100 ml or more: Secondary | ICD-10-CM | POA: Diagnosis present

## 2021-09-03 DIAGNOSIS — S42032A Displaced fracture of lateral end of left clavicle, initial encounter for closed fracture: Secondary | ICD-10-CM | POA: Diagnosis present

## 2021-09-03 DIAGNOSIS — Z20822 Contact with and (suspected) exposure to covid-19: Secondary | ICD-10-CM | POA: Diagnosis present

## 2021-09-03 DIAGNOSIS — S0101XA Laceration without foreign body of scalp, initial encounter: Secondary | ICD-10-CM | POA: Diagnosis present

## 2021-09-03 DIAGNOSIS — S32491A Other specified fracture of right acetabulum, initial encounter for closed fracture: Secondary | ICD-10-CM | POA: Diagnosis present

## 2021-09-03 DIAGNOSIS — S62617A Displaced fracture of proximal phalanx of left little finger, initial encounter for closed fracture: Secondary | ICD-10-CM | POA: Diagnosis present

## 2021-09-03 DIAGNOSIS — F10129 Alcohol abuse with intoxication, unspecified: Secondary | ICD-10-CM | POA: Diagnosis present

## 2021-09-03 DIAGNOSIS — S32424A Nondisplaced fracture of posterior wall of right acetabulum, initial encounter for closed fracture: Secondary | ICD-10-CM | POA: Diagnosis present

## 2021-09-03 DIAGNOSIS — S42112A Displaced fracture of body of scapula, left shoulder, initial encounter for closed fracture: Secondary | ICD-10-CM | POA: Diagnosis present

## 2021-09-03 DIAGNOSIS — Y92488 Other paved roadways as the place of occurrence of the external cause: Secondary | ICD-10-CM | POA: Diagnosis not present

## 2021-09-03 DIAGNOSIS — S01511A Laceration without foreign body of lip, initial encounter: Secondary | ICD-10-CM | POA: Diagnosis present

## 2021-09-03 DIAGNOSIS — M25552 Pain in left hip: Secondary | ICD-10-CM | POA: Diagnosis present

## 2021-09-03 DIAGNOSIS — S0081XA Abrasion of other part of head, initial encounter: Secondary | ICD-10-CM | POA: Diagnosis present

## 2021-09-03 DIAGNOSIS — D62 Acute posthemorrhagic anemia: Secondary | ICD-10-CM | POA: Diagnosis present

## 2021-09-03 DIAGNOSIS — Z23 Encounter for immunization: Secondary | ICD-10-CM | POA: Diagnosis not present

## 2021-09-03 DIAGNOSIS — S32409A Unspecified fracture of unspecified acetabulum, initial encounter for closed fracture: Secondary | ICD-10-CM | POA: Diagnosis present

## 2021-09-03 LAB — COMPREHENSIVE METABOLIC PANEL
ALT: 17 U/L (ref 0–44)
AST: 31 U/L (ref 15–41)
Albumin: 4.1 g/dL (ref 3.5–5.0)
Alkaline Phosphatase: 76 U/L (ref 38–126)
Anion gap: 11 (ref 5–15)
BUN: 6 mg/dL (ref 6–20)
CO2: 17 mmol/L — ABNORMAL LOW (ref 22–32)
Calcium: 8.4 mg/dL — ABNORMAL LOW (ref 8.9–10.3)
Chloride: 106 mmol/L (ref 98–111)
Creatinine, Ser: 0.67 mg/dL (ref 0.44–1.00)
GFR, Estimated: >60 mL/min (ref >60)
Glucose, Bld: 120 mg/dL — ABNORMAL HIGH (ref 70–99)
Potassium: 3.2 mmol/L — ABNORMAL LOW (ref 3.5–5.1)
Sodium: 134 mmol/L — ABNORMAL LOW (ref 135–145)
Total Bilirubin: 0.3 mg/dL (ref 0.3–1.2)
Total Protein: 7.3 g/dL (ref 6.5–8.1)

## 2021-09-03 LAB — RESP PANEL BY RT-PCR (FLU A&B, COVID) ARPGX2
Influenza A by PCR: NEGATIVE
Influenza B by PCR: NEGATIVE
SARS Coronavirus 2 by RT PCR: NEGATIVE

## 2021-09-03 LAB — RAPID URINE DRUG SCREEN, HOSP PERFORMED
Amphetamines: NOT DETECTED
Barbiturates: NOT DETECTED
Benzodiazepines: NOT DETECTED
Cocaine: POSITIVE — AB
Opiates: NOT DETECTED
Tetrahydrocannabinol: NOT DETECTED

## 2021-09-03 LAB — I-STAT CHEM 8, ED
BUN: 6 mg/dL (ref 6–20)
Calcium, Ion: 1.03 mmol/L — ABNORMAL LOW (ref 1.15–1.40)
Chloride: 108 mmol/L (ref 98–111)
Creatinine, Ser: 1 mg/dL (ref 0.44–1.00)
Glucose, Bld: 118 mg/dL — ABNORMAL HIGH (ref 70–99)
HCT: 43 % (ref 36.0–46.0)
Hemoglobin: 14.6 g/dL (ref 12.0–15.0)
Potassium: 3.3 mmol/L — ABNORMAL LOW (ref 3.5–5.1)
Sodium: 139 mmol/L (ref 135–145)
TCO2: 18 mmol/L — ABNORMAL LOW (ref 22–32)

## 2021-09-03 LAB — CBC
HCT: 42.8 % (ref 36.0–46.0)
Hemoglobin: 13.8 g/dL (ref 12.0–15.0)
MCH: 31 pg (ref 26.0–34.0)
MCHC: 32.2 g/dL (ref 30.0–36.0)
MCV: 96.2 fL (ref 80.0–100.0)
Platelets: 389 10*3/uL (ref 150–400)
RBC: 4.45 MIL/uL (ref 3.87–5.11)
RDW: 12 % (ref 11.5–15.5)
WBC: 15.2 10*3/uL — ABNORMAL HIGH (ref 4.0–10.5)
nRBC: 0 % (ref 0.0–0.2)

## 2021-09-03 LAB — PREGNANCY, URINE: Preg Test, Ur: NEGATIVE

## 2021-09-03 LAB — HEMOGLOBIN AND HEMATOCRIT, BLOOD
HCT: 35.5 % — ABNORMAL LOW (ref 36.0–46.0)
HCT: 32.4 % — ABNORMAL LOW (ref 36.0–46.0)
Hemoglobin: 11.8 g/dL — ABNORMAL LOW (ref 12.0–15.0)
Hemoglobin: 10.6 g/dL — ABNORMAL LOW (ref 12.0–15.0)

## 2021-09-03 LAB — URINALYSIS, MICROSCOPIC (REFLEX): Squamous Epithelial / HPF: NONE SEEN (ref 0–5)

## 2021-09-03 LAB — URINALYSIS, ROUTINE W REFLEX MICROSCOPIC
Bilirubin Urine: NEGATIVE
Glucose, UA: NEGATIVE mg/dL
Ketones, ur: 15 mg/dL — AB
Leukocytes,Ua: NEGATIVE
Nitrite: NEGATIVE
Protein, ur: NEGATIVE mg/dL
Specific Gravity, Urine: 1.01 (ref 1.005–1.030)
pH: 6 (ref 5.0–8.0)

## 2021-09-03 LAB — HIV ANTIBODY (ROUTINE TESTING W REFLEX): HIV Screen 4th Generation wRfx: NONREACTIVE

## 2021-09-03 LAB — LACTIC ACID, PLASMA: Lactic Acid, Venous: 3.2 mmol/L (ref 0.5–1.9)

## 2021-09-03 LAB — PROTIME-INR
INR: 1.1 (ref 0.8–1.2)
Prothrombin Time: 14.3 seconds (ref 11.4–15.2)

## 2021-09-03 LAB — SAMPLE TO BLOOD BANK

## 2021-09-03 LAB — I-STAT BETA HCG BLOOD, ED (MC, WL, AP ONLY): I-stat hCG, quantitative: 15 m[IU]/mL — ABNORMAL HIGH (ref <5)

## 2021-09-03 LAB — ETHANOL: Alcohol, Ethyl (B): 241 mg/dL — ABNORMAL HIGH (ref <10)

## 2021-09-03 MED ORDER — HYDROMORPHONE HCL 1 MG/ML IJ SOLN
0.5000 mg | Freq: Once | INTRAMUSCULAR | Status: AC
  Administered 2021-09-03: 06:00:00 0.5 mg via INTRAVENOUS
  Filled 2021-09-03: qty 1

## 2021-09-03 MED ORDER — OXYCODONE HCL 5 MG PO TABS
5.0000 mg | ORAL_TABLET | ORAL | Status: DC | PRN
  Administered 2021-09-04 – 2021-09-05 (×3): 5 mg via ORAL
  Filled 2021-09-03 (×3): qty 1

## 2021-09-03 MED ORDER — LORAZEPAM 1 MG PO TABS
0.0000 mg | ORAL_TABLET | Freq: Four times a day (QID) | ORAL | Status: AC
  Administered 2021-09-04: 2 mg via ORAL
  Filled 2021-09-03 (×2): qty 2

## 2021-09-03 MED ORDER — ONDANSETRON HCL 4 MG/2ML IJ SOLN: 4.0000 mg | Freq: Four times a day (QID) | INTRAMUSCULAR | Status: DC | PRN

## 2021-09-03 MED ORDER — MORPHINE SULFATE (PF) 4 MG/ML IV SOLN
4.0000 mg | INTRAVENOUS | Status: DC | PRN
  Administered 2021-09-03 – 2021-09-04 (×4): 4 mg via INTRAVENOUS
  Filled 2021-09-03 (×4): qty 1

## 2021-09-03 MED ORDER — FENTANYL CITRATE PF 50 MCG/ML IJ SOSY
50.0000 ug | PREFILLED_SYRINGE | Freq: Once | INTRAMUSCULAR | Status: AC
  Administered 2021-09-03: 09:00:00 50 ug via INTRAVENOUS
  Filled 2021-09-03: qty 1

## 2021-09-03 MED ORDER — LORAZEPAM 1 MG PO TABS: 0.0000 mg | ORAL_TABLET | Freq: Two times a day (BID) | ORAL | Status: DC

## 2021-09-03 MED ORDER — LIDOCAINE HCL (PF) 1 % IJ SOLN
30.0000 mL | Freq: Once | INTRAMUSCULAR | Status: AC
  Administered 2021-09-03: 07:00:00 30 mL
  Filled 2021-09-03: qty 30

## 2021-09-03 MED ORDER — HYDROMORPHONE HCL 1 MG/ML IJ SOLN
1.0000 mg | Freq: Once | INTRAMUSCULAR | Status: AC
  Administered 2021-09-03: 10:00:00 1 mg via INTRAVENOUS
  Filled 2021-09-03: qty 1

## 2021-09-03 MED ORDER — FOLIC ACID 1 MG PO TABS
1.0000 mg | ORAL_TABLET | Freq: Every day | ORAL | Status: DC
  Administered 2021-09-04 – 2021-09-05 (×2): 1 mg via ORAL
  Filled 2021-09-03 (×3): qty 1

## 2021-09-03 MED ORDER — SODIUM CHLORIDE 0.9 % IV SOLN
1000.0000 mL | INTRAVENOUS | Status: DC
  Administered 2021-09-03: 06:00:00 1000 mL via INTRAVENOUS

## 2021-09-03 MED ORDER — FLUOXETINE HCL 10 MG PO CAPS: 10.0000 mg | ORAL_CAPSULE | Freq: Every day | ORAL | Status: DC

## 2021-09-03 MED ORDER — FENTANYL CITRATE PF 50 MCG/ML IJ SOSY
75.0000 ug | PREFILLED_SYRINGE | Freq: Once | INTRAMUSCULAR | Status: AC
  Administered 2021-09-03: 06:00:00 75 ug via INTRAVENOUS
  Filled 2021-09-03: qty 2

## 2021-09-03 MED ORDER — ADULT MULTIVITAMIN W/MINERALS CH
1.0000 | ORAL_TABLET | Freq: Every day | ORAL | Status: DC
  Administered 2021-09-04 – 2021-09-05 (×2): 1 via ORAL
  Filled 2021-09-03 (×3): qty 1

## 2021-09-03 MED ORDER — SODIUM CHLORIDE 0.9 % IV BOLUS (SEPSIS)
1000.0000 mL | Freq: Once | INTRAVENOUS | Status: AC
  Administered 2021-09-03: 06:00:00 1000 mL via INTRAVENOUS

## 2021-09-03 MED ORDER — ACETAMINOPHEN 325 MG PO TABS
650.0000 mg | ORAL_TABLET | Freq: Four times a day (QID) | ORAL | Status: DC
  Administered 2021-09-03 – 2021-09-05 (×7): 650 mg via ORAL
  Filled 2021-09-03 (×8): qty 2

## 2021-09-03 MED ORDER — METHOCARBAMOL 750 MG PO TABS
750.0000 mg | ORAL_TABLET | Freq: Three times a day (TID) | ORAL | Status: DC
  Administered 2021-09-03 – 2021-09-05 (×7): 750 mg via ORAL
  Filled 2021-09-03: qty 2
  Filled 2021-09-03 (×5): qty 1
  Filled 2021-09-03: qty 2

## 2021-09-03 MED ORDER — LACTATED RINGERS IV SOLN: INTRAVENOUS | Status: DC

## 2021-09-03 MED ORDER — THIAMINE HCL 100 MG PO TABS
100.0000 mg | ORAL_TABLET | Freq: Every day | ORAL | Status: DC
  Administered 2021-09-04 – 2021-09-05 (×2): 100 mg via ORAL
  Filled 2021-09-03 (×3): qty 1

## 2021-09-03 MED ORDER — THIAMINE HCL 100 MG/ML IJ SOLN: 100.0000 mg | Freq: Every day | INTRAMUSCULAR | Status: DC

## 2021-09-03 MED ORDER — SODIUM CHLORIDE 0.9 % IV BOLUS
1000.0000 mL | Freq: Once | INTRAVENOUS | Status: AC
  Administered 2021-09-03: 08:00:00 1000 mL via INTRAVENOUS

## 2021-09-03 MED ORDER — OXYCODONE HCL 5 MG PO TABS
10.0000 mg | ORAL_TABLET | ORAL | Status: DC | PRN
  Administered 2021-09-03 – 2021-09-05 (×4): 10 mg via ORAL
  Filled 2021-09-03 (×4): qty 2

## 2021-09-03 MED ORDER — IOHEXOL 350 MG/ML SOLN
80.0000 mL | Freq: Once | INTRAVENOUS | Status: AC | PRN
  Administered 2021-09-03: 05:00:00 80 mL via INTRAVENOUS

## 2021-09-03 MED ORDER — LORAZEPAM 1 MG PO TABS: 1.0000 mg | ORAL_TABLET | ORAL | Status: DC | PRN

## 2021-09-03 MED ORDER — ENOXAPARIN SODIUM 30 MG/0.3ML IJ SOSY
30.0000 mg | PREFILLED_SYRINGE | Freq: Two times a day (BID) | INTRAMUSCULAR | Status: DC
  Administered 2021-09-04 – 2021-09-05 (×2): 30 mg via SUBCUTANEOUS
  Filled 2021-09-03 (×3): qty 0.3

## 2021-09-03 MED ORDER — LORAZEPAM 2 MG/ML IJ SOLN: 1.0000 mg | INTRAMUSCULAR | Status: DC | PRN

## 2021-09-03 MED ORDER — ONDANSETRON 4 MG PO TBDP
4.0000 mg | ORAL_TABLET | Freq: Four times a day (QID) | ORAL | Status: DC | PRN
  Administered 2021-09-03: 13:00:00 4 mg via ORAL
  Filled 2021-09-03: qty 1

## 2021-09-03 MED ORDER — TETANUS-DIPHTH-ACELL PERTUSSIS 5-2.5-18.5 LF-MCG/0.5 IM SUSY
0.5000 mL | PREFILLED_SYRINGE | Freq: Once | INTRAMUSCULAR | Status: AC
  Administered 2021-09-03: 05:00:00 0.5 mL via INTRAMUSCULAR
  Filled 2021-09-03: qty 0.5

## 2021-09-03 MED ORDER — LIDOCAINE-EPINEPHRINE (PF) 2 %-1:200000 IJ SOLN
20.0000 mL | Freq: Once | INTRAMUSCULAR | Status: AC
  Administered 2021-09-03: 20 mL
  Filled 2021-09-03: qty 20

## 2021-09-03 MED ORDER — SODIUM CHLORIDE 0.9 % IV SOLN
INTRAVENOUS | Status: AC | PRN
  Administered 2021-09-03: 1000 mL via INTRAVENOUS

## 2021-09-03 MED ORDER — LACTATED RINGERS IV BOLUS
1000.0000 mL | Freq: Once | INTRAVENOUS | Status: AC
  Administered 2021-09-03: 14:00:00 1000 mL via INTRAVENOUS
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
## 2021-09-03 NOTE — Progress Notes (Signed)
Orthopedic Tech Progress Note Patient Details:  Eleonor Ocon 2000-10-09 [id number]  Patient ID: Timmie Foerster, female   DOB: 2001-07-25, 20 y.o.   MRN: [id number] Level II; not needed at the moment.  Darleen Crocker 09/03/2021, 4:40 AM
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
## 2021-09-03 NOTE — ED Notes (Signed)
Ortho at the bedside.
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
## 2021-09-03 NOTE — ED Provider Notes (Signed)
Please see Dr. Harland Dingwall note for full H&P.  Cleansed with betadine, pressure irrigated, no obvious Fbs, no obvious galeal involvement. Repaired per procedure note below.   Marland Kitchen.Laceration Repair  Date/Time: 09/03/2021 6:58 AM Performed by: Cherly Anderson, PA-C Authorized by: Cherly Anderson, PA-C   Consent:    Consent obtained:  Verbal   Consent given by:  Patient and parent   Risks, benefits, and alternatives were discussed: yes     Risks discussed:  Infection, need for additional repair, nerve damage, poor wound healing, poor cosmetic result, pain, retained foreign body, tendon damage and vascular damage   Alternatives discussed:  No treatment Anesthesia:    Anesthesia method:  Local infiltration   Local anesthetic:  Lidocaine 2% WITH epi Laceration details:    Location:  Scalp   Scalp location:  L parietal   Length (cm):  12   Depth (mm):  9 Pre-procedure details:    Preparation:  Patient was prepped and draped in usual sterile fashion and imaging obtained to evaluate for foreign bodies Exploration:    Hemostasis achieved with:  Direct pressure and epinephrine   Imaging outcome: foreign body not noted     Wound exploration: wound explored through full range of motion and entire depth of wound visualized     Contaminated: no   Treatment:    Area cleansed with:  Povidone-iodine   Amount of cleaning:  Standard   Irrigation solution:  Sterile water   Irrigation method:  Pressure wash   Layers/structures repaired:  Deep subcutaneous Deep subcutaneous:    Suture size:  5-0   Suture material:  Vicryl   Suture technique:  Simple interrupted   Number of sutures:  7 Skin repair:    Repair method:  Staples   Number of staples:  18 Approximation:    Approximation:  Close Post-procedure details:    Procedure completion:  Tolerated well, no immediate complications         Cherly Anderson, PA-C 09/03/21 0702    Nira Conn, MD 09/03/21  [phone]
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
## 2021-09-03 NOTE — ED Notes (Signed)
Mother at bedside and updated as to patient's status.

## 2021-09-03 NOTE — ED Notes (Addendum)
Patient transported to CT with Trauma RN 

## 2021-09-03 NOTE — ED Notes (Signed)
Kristina Gay at bedside to repair head lacerations.
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
## 2021-09-03 NOTE — ED Notes (Signed)
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  thiamine (B-1) injection 100 mg ( Intravenous See Alternative [id number])  folic acid (FOLVITE) tablet 1 mg (1 mg Oral Not Given 09/03/21 1253)  multivitamin with minerals tablet 1 tablet (1 tablet Oral Not Given 09/03/21 1254)  LORazepam (ATIVAN) tablet 0-4 mg (0 mg Oral Not Given 09/03/21 1705)    Followed by  LORazepam (ATIVAN) tablet 0-4 mg (has no administration in time range)  Tdap (BOOSTRIX) injection 0.5 mL (0.5 mLs Intramuscular Given 09/03/21 0514)  0.9 %  sodium chloride infusion (0 mLs Intravenous Stopped 09/03/21 0537)  iohexol (OMNIPAQUE) 350 MG/ML injection 80 mL (80 mLs Intravenous Contrast Given 09/03/21 0514)  fentaNYL (SUBLIMAZE) injection 75 mcg (75 mcg Intravenous Given 09/03/21 0539)   sodium chloride 0.9 % bolus 1,000 mL (0 mLs Intravenous Stopped 09/03/21 0618)  lidocaine-EPINEPHrine (XYLOCAINE W/EPI) 2 %-1:200000 (PF) injection 20 mL (20 mLs Infiltration Given 09/03/21 0545)  HYDROmorphone (DILAUDID) injection 0.5 mg (0.5 mg Intravenous Given 09/03/21 0618)  lidocaine (PF) (XYLOCAINE) 1 % injection 30 mL (30 mLs Other Given by Other 09/03/21 [id number])  sodium chloride 0.9 % bolus 1,000 mL (0 mLs Intravenous Stopped 09/03/21 1104)  fentaNYL (SUBLIMAZE) injection 50 mcg (50 mcg Intravenous Given 09/03/21 0832)  HYDROmorphone (DILAUDID) injection 1 mg (1 mg Intravenous Given 09/03/21 1015)  lactated ringers bolus 1,000 mL (0 mLs Intravenous Stopped 09/03/21 1639)    Mobility non-ambulatory High fall risk   Focused Assessments Cardiac Assessment Handoff:  Cardiac Rhythm: Sinus tachycardia No results found for: CKTOTAL, CKMB, CKMBINDEX, TROPONINI No results found for: DDIMER Does the Patient currently have chest pain? No   , Neuro Assessment Handoff:  Swallow screen pass? Yes  Cardiac Rhythm: Sinus tachycardia       Neuro Assessment: Within Defined Limits Neuro Checks:      Last Documented NIHSS Modified Score:   Has TPA been given? No If patient is a Neuro Trauma and patient is going to OR before floor call report to [facility] nurse: [phone] or [phone]   R Recommendations: See Admitting Provider Note  Report given to:   Additional Notes: none
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
## 2021-09-03 NOTE — Discharge Instructions (Addendum)
Regarding scalp/ lip lacerations: Do not let your laceration (cut) get wet for the next 48 hours. After that you may allow soapy water to drain down the wound to clean it. Please do not scrub.  To minimize scarring, you can apply a vaseline based ointment for the next 2 weeks and keep it out of direct sun light. After that, you may apply sunscreen for the next several months.  Your staples in your scalp will need to be removed in 10-12 days. The sutures on your lip will dissolve on their own.   Return if your wound appears to be infected (see laceration care instructions).   Orthopedic injuries: (left scapula, left clavicle, left small finger, right acetabulum) Continue splint to left upper extremity, nonweightbearing Touch down weight bearing to right leg Follow up with orthopedics   Elevated hcg: You are likely very early pregnant. Take prenatal vitamins. Recommend stop drinking alcohol or using any illicit drugs. Follow up with your ob/gyn for further evaluation

## 2021-09-03 NOTE — ED Notes (Signed)
Ortho notified of need for splint. 

## 2021-09-03 NOTE — Progress Notes (Signed)
Orthopedic Tech Progress Note Patient Details:  Kristina Gay 12/29/2000 [id number]  Ortho Devices Type of Ortho Device: Cotton web roll, Ace wrap, Arm sling, Ulna gutter splint Ortho Device/Splint Location: left Ortho Device/Splint Interventions: Ordered, Application, Adjustment   Post Interventions Patient Tolerated: Well Instructions Provided: Care of device  Delorise Royals [name] 09/03/2021, 9:14 AM Sling to be applied by nurse once patient is discharged.
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
## 2021-09-03 NOTE — ED Notes (Signed)
Pt to CT at 0445 and back in room at 0508

## 2021-09-04 LAB — BASIC METABOLIC PANEL
Anion gap: 5 (ref 5–15)
BUN: 5 mg/dL — ABNORMAL LOW (ref 6–20)
CO2: 24 mmol/L (ref 22–32)
Calcium: 8.2 mg/dL — ABNORMAL LOW (ref 8.9–10.3)
Chloride: 104 mmol/L (ref 98–111)
Creatinine, Ser: 0.64 mg/dL (ref 0.44–1.00)
GFR, Estimated: >60 mL/min (ref >60)
Glucose, Bld: 99 mg/dL (ref 70–99)
Potassium: 3.3 mmol/L — ABNORMAL LOW (ref 3.5–5.1)
Sodium: 133 mmol/L — ABNORMAL LOW (ref 135–145)

## 2021-09-04 LAB — MAGNESIUM: Magnesium: 1.6 mg/dL — ABNORMAL LOW (ref 1.7–2.4)

## 2021-09-04 LAB — CBC
HCT: 32 % — ABNORMAL LOW (ref 36.0–46.0)
Hemoglobin: 10.9 g/dL — ABNORMAL LOW (ref 12.0–15.0)
MCH: 31.6 pg (ref 26.0–34.0)
MCHC: 34.1 g/dL (ref 30.0–36.0)
MCV: 92.8 fL (ref 80.0–100.0)
Platelets: 274 10*3/uL (ref 150–400)
RBC: 3.45 MIL/uL — ABNORMAL LOW (ref 3.87–5.11)
RDW: 12.1 % (ref 11.5–15.5)
WBC: 9.2 10*3/uL (ref 4.0–10.5)
nRBC: 0 % (ref 0.0–0.2)

## 2021-09-04 LAB — HCG, QUANTITATIVE, PREGNANCY: hCG, Beta Chain, Quant, S: 30 m[IU]/mL — ABNORMAL HIGH (ref <5)

## 2021-09-04 MED ORDER — POTASSIUM CHLORIDE CRYS ER 20 MEQ PO TBCR
40.0000 meq | EXTENDED_RELEASE_TABLET | Freq: Two times a day (BID) | ORAL | Status: AC
  Administered 2021-09-04 (×2): 40 meq via ORAL
  Filled 2021-09-04 (×2): qty 2

## 2021-09-04 MED ORDER — BACITRACIN ZINC 500 UNIT/GM EX OINT
TOPICAL_OINTMENT | Freq: Two times a day (BID) | CUTANEOUS | Status: DC
  Filled 2021-09-04: qty 28.4

## 2021-09-04 MED ORDER — MAGNESIUM OXIDE -MG SUPPLEMENT 400 (240 MG) MG PO TABS
400.0000 mg | ORAL_TABLET | Freq: Two times a day (BID) | ORAL | Status: AC
  Administered 2021-09-04 (×2): 400 mg via ORAL
  Filled 2021-09-04 (×2): qty 1

## 2021-09-04 MED ORDER — SODIUM CHLORIDE 0.9 % IV BOLUS
500.0000 mL | Freq: Once | INTRAVENOUS | Status: AC
  Administered 2021-09-04: 500 mL via INTRAVENOUS
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
## 2021-09-04 NOTE — Progress Notes (Signed)
OT Cancellation Note  Patient Details Name: Kristina Gay MRN: [id number] DOB: 2001-06-07   Cancelled Treatment:    Reason Eval/Treat Not Completed: Other (comment). Pt just got breakfast and requesting to wait until 9:00 to work with therapy.  Ignacia Palma, OTR/L Acute Rehab Services Pager [phone] Office [phone]    [name] 09/04/2021, 8:02 AM
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
## 2021-09-05 ENCOUNTER — Other Ambulatory Visit (HOSPITAL_COMMUNITY): Payer: Self-pay

## 2021-09-05 LAB — BASIC METABOLIC PANEL
Anion gap: 9 (ref 5–15)
BUN: 5 mg/dL — ABNORMAL LOW (ref 6–20)
CO2: 21 mmol/L — ABNORMAL LOW (ref 22–32)
Calcium: 8.3 mg/dL — ABNORMAL LOW (ref 8.9–10.3)
Chloride: 103 mmol/L (ref 98–111)
Creatinine, Ser: 0.59 mg/dL (ref 0.44–1.00)
GFR, Estimated: >60 mL/min (ref >60)
Glucose, Bld: 131 mg/dL — ABNORMAL HIGH (ref 70–99)
Potassium: 3.6 mmol/L (ref 3.5–5.1)
Sodium: 133 mmol/L — ABNORMAL LOW (ref 135–145)

## 2021-09-05 LAB — CBC
HCT: 32.5 % — ABNORMAL LOW (ref 36.0–46.0)
Hemoglobin: 11.1 g/dL — ABNORMAL LOW (ref 12.0–15.0)
MCH: 32 pg (ref 26.0–34.0)
MCHC: 34.2 g/dL (ref 30.0–36.0)
MCV: 93.7 fL (ref 80.0–100.0)
Platelets: 281 10*3/uL (ref 150–400)
RBC: 3.47 MIL/uL — ABNORMAL LOW (ref 3.87–5.11)
RDW: 12 % (ref 11.5–15.5)
WBC: 9.4 10*3/uL (ref 4.0–10.5)
nRBC: 0 % (ref 0.0–0.2)

## 2021-09-05 LAB — HCG, QUANTITATIVE, PREGNANCY: hCG, Beta Chain, Quant, S: 55 m[IU]/mL — ABNORMAL HIGH (ref <5)

## 2021-09-05 MED ORDER — DOCUSATE SODIUM 100 MG PO CAPS
100.0000 mg | ORAL_CAPSULE | Freq: Two times a day (BID) | ORAL | Status: DC
  Administered 2021-09-05: 100 mg via ORAL
  Filled 2021-09-05: qty 1

## 2021-09-05 MED ORDER — PRENATAL MULTIVITAMIN CH
1.0000 | ORAL_TABLET | Freq: Every day | ORAL | Status: DC
  Administered 2021-09-05: 1 via ORAL
  Filled 2021-09-05: qty 1

## 2021-09-05 MED ORDER — POLYETHYLENE GLYCOL 3350 17 G PO PACK
17.0000 g | PACK | Freq: Every day | ORAL | Status: DC
  Administered 2021-09-05: 17 g via ORAL
  Filled 2021-09-05: qty 1

## 2021-09-05 MED ORDER — MORPHINE SULFATE (PF) 2 MG/ML IV SOLN: 2.0000 mg | Freq: Four times a day (QID) | INTRAVENOUS | Status: DC | PRN

## 2021-09-05 MED ORDER — BACITRACIN ZINC 500 UNIT/GM EX OINT: TOPICAL_OINTMENT | Freq: Two times a day (BID) | CUTANEOUS | 0 refills | Status: DC

## 2021-09-05 MED ORDER — FOLIC ACID 1 MG PO TABS
1.0000 mg | ORAL_TABLET | Freq: Every day | ORAL | 0 refills | Status: AC
  Filled 2021-09-05: qty 30, 30d supply, fill #0

## 2021-09-05 MED ORDER — POLYETHYLENE GLYCOL 3350 17 G PO PACK: 17.0000 g | PACK | Freq: Every day | ORAL | 0 refills | Status: DC

## 2021-09-05 MED ORDER — METHOCARBAMOL 750 MG PO TABS
750.0000 mg | ORAL_TABLET | Freq: Three times a day (TID) | ORAL | 0 refills | Status: DC | PRN
  Filled 2021-09-05: qty 30, 10d supply, fill #0

## 2021-09-05 MED ORDER — OXYCODONE HCL 10 MG PO TABS
5.0000 mg | ORAL_TABLET | Freq: Four times a day (QID) | ORAL | 0 refills | Status: DC | PRN
  Filled 2021-09-05: qty 25, 7d supply, fill #0

## 2021-09-05 MED ORDER — PRENATAL/FOLIC ACID PO TABS
1.0000 | ORAL_TABLET | Freq: Every day | ORAL | 0 refills | Status: AC
  Filled 2021-09-05: qty 30, 30d supply, fill #0

## 2021-09-05 MED ORDER — ENOXAPARIN SODIUM 40 MG/0.4ML IJ SOSY
40.0000 mg | PREFILLED_SYRINGE | INTRAMUSCULAR | 0 refills | Status: DC
Start: 2021-09-05 — End: 2021-10-08
  Filled 2021-09-05: qty 5.6, 14d supply, fill #0

## 2021-09-05 MED ORDER — ACETAMINOPHEN 325 MG PO TABS: 650.0000 mg | ORAL_TABLET | Freq: Four times a day (QID) | ORAL | Status: DC | PRN

## 2021-09-05 MED ORDER — THIAMINE HCL 100 MG PO TABS
100.0000 mg | ORAL_TABLET | Freq: Every day | ORAL | 0 refills | Status: DC
Start: 2021-09-06 — End: 2021-12-01
  Filled 2021-09-05: qty 30, 30d supply, fill #0
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
## 2021-09-05 NOTE — TOC Transition Note (Addendum)
Transition of Care [facility]) - CM/SW Discharge Note   Patient Details  Name: Kristina Gay MRN: [id number] Date of Birth: 04-04-2001  Transition of Care [facility]) CM/SW Contact:  Ella Bodo, RN Phone Number: 09/05/2021, 11:28 AM   Clinical Narrative:    Pt medically stable for discharge home today with mother, who states she will have 24h supervision at home.  Recommended DME has been delivered to the bedside. [place] to deliver discharge medications to bedside when filled.  Met with pt and mother; she states financial counselor has reached out this morning to her, and will be coming to bedside to assist with financial assistance paperwork.  Mom appreciative of assistance; no other dc needs identified.      Barriers to discharge: Barriers resolved  Patient Goals and CMS Choice Patient states their goals for this hospitalization and ongoing recovery are:: to feel better                            Discharge Plan and Services   Discharge Planning Services: CM Consult, Medication Assistance, [place] Program            DME Arranged: 3-N-1, Tub bench, Wheelchair manual DME Agency: [employer] Date DME Agency Contacted: 09/04/21 Time DME Agency Contacted: [phone] Representative spoke with at DME Agency: [facility] ([place]) Interventions     Readmission Risk Interventions No flowsheet data found.  Reinaldo Raddle, RN, BSN  Trauma/Neuro ICU Case Manager [phone]
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
## 2021-09-05 NOTE — Progress Notes (Signed)
Pt seen by OT and PT and cleared for discharge. Wheelchair, 3n1, and tub/shower bench already delivered to bedside. TOC pharmacy delivered pt's medications to bedside. PIV removed. Pt and her mother educated on discharge information and medication use. Pt's mother agrees to administer Lovenox injections, and says she has prior experience with it. Pt taken to private vehicle in wheelchair with all belongings and equipment.   Robina Ade, RN

## 2021-09-05 NOTE — TOC CAGE-AID Note (Addendum)
Transition of Care [facility]) - CAGE-AID Screening   Patient Details  Name: Kristina Gay MRN: [id number] Date of Birth: Jun 07, 2001  Transition of Care [facility]) CM/SW Contact:    Danel Requena C Tarpley-Carter, LCSWA Phone Number: 09/05/2021, 2:35 PM   Clinical Narrative: Pt participated in Cage-Aid.  Pt stated she does use substance and ETOH.  Pt stated she uses socially.  Pt was offered resources, due to usage of substance and ETOH. CSW provided pt with resources during consult.  Imojean Yoshino Tarpley-Carter, MSW, LCSW-A Pronouns:  She/Her/Hers [place] Transitions of Care Clinical Social Worker Direct Number:  [phone] [email or web address]      CAGE-AID Screening:    Have You Ever Felt You Ought to Cut Down on Your Drinking or Drug Use?: Yes Have People Annoyed You By Critizing Your Drinking Or Drug Use?: Yes Have You Felt Bad Or Guilty About Your Drinking Or Drug Use?: Yes Have You Ever Had a Drink or Used Drugs First Thing In The Morning to Steady Your Nerves or to Get Rid of a Hangover?: No CAGE-AID Score: 3  Substance Abuse Education Offered: Yes  Substance abuse interventions: Transport planner

## 2021-09-05 NOTE — Progress Notes (Signed)
Patient ID: Kristina Gay, female   DOB: Aug 12, 2001, 20 y.o.   MRN: [id number] Her mother had some additional questions and I spoke with her at the bedside. Plan D/C this PM.  Violeta Gelinas, MD, MPH, FACS Please use [email or web address] to contact on call provider
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
## 2021-09-09 ENCOUNTER — Other Ambulatory Visit (HOSPITAL_COMMUNITY): Payer: Self-pay

## 2021-09-21 ENCOUNTER — Encounter: Payer: Self-pay | Admitting: Intensive Care

## 2021-09-21 ENCOUNTER — Emergency Department: Payer: Medicaid Other

## 2021-09-21 ENCOUNTER — Ambulatory Visit
Admission: EM | Admit: 2021-09-21 | Discharge: 2021-09-21 | Disposition: A | Discharge status: Home / Self Care | Payer: No Typology Code available for payment source | Source: Ambulatory Visit | Attending: Emergency Medicine | Admitting: Emergency Medicine

## 2021-09-21 ENCOUNTER — Other Ambulatory Visit: Payer: Self-pay

## 2021-09-21 ENCOUNTER — Emergency Department
Admission: EM | Admit: 2021-09-21 | Discharge: 2021-09-21 | Disposition: A | Discharge status: Home / Self Care | Payer: Medicaid Other | Attending: Emergency Medicine | Admitting: Emergency Medicine

## 2021-09-21 DIAGNOSIS — T1490XA Injury, unspecified, initial encounter: Secondary | ICD-10-CM

## 2021-09-21 DIAGNOSIS — Z0441 Encounter for examination and observation following alleged adult rape: Secondary | ICD-10-CM | POA: Diagnosis not present

## 2021-09-21 DIAGNOSIS — S0101XA Laceration without foreign body of scalp, initial encounter: Secondary | ICD-10-CM | POA: Diagnosis not present

## 2021-09-21 DIAGNOSIS — S32401A Unspecified fracture of right acetabulum, initial encounter for closed fracture: Secondary | ICD-10-CM | POA: Diagnosis not present

## 2021-09-21 DIAGNOSIS — S4992XA Unspecified injury of left shoulder and upper arm, initial encounter: Secondary | ICD-10-CM | POA: Diagnosis present

## 2021-09-21 DIAGNOSIS — S01511A Laceration without foreign body of lip, initial encounter: Secondary | ICD-10-CM | POA: Insufficient documentation

## 2021-09-21 DIAGNOSIS — R52 Pain, unspecified: Secondary | ICD-10-CM

## 2021-09-21 DIAGNOSIS — S42002A Fracture of unspecified part of left clavicle, initial encounter for closed fracture: Secondary | ICD-10-CM | POA: Insufficient documentation

## 2021-09-21 DIAGNOSIS — S62617A Displaced fracture of proximal phalanx of left little finger, initial encounter for closed fracture: Secondary | ICD-10-CM | POA: Diagnosis not present

## 2021-09-21 DIAGNOSIS — T7421XA Adult sexual abuse, confirmed, initial encounter: Secondary | ICD-10-CM | POA: Diagnosis not present

## 2021-09-21 DIAGNOSIS — S42102A Fracture of unspecified part of scapula, left shoulder, initial encounter for closed fracture: Secondary | ICD-10-CM | POA: Insufficient documentation

## 2021-09-21 LAB — COMPREHENSIVE METABOLIC PANEL
ALT: 27 U/L (ref 0–44)
AST: 26 U/L (ref 15–41)
Albumin: 4.3 g/dL (ref 3.5–5.0)
Alkaline Phosphatase: 123 U/L (ref 38–126)
Anion gap: 8 (ref 5–15)
BUN: 11 mg/dL (ref 6–20)
CO2: 26 mmol/L (ref 22–32)
Calcium: 9.2 mg/dL (ref 8.9–10.3)
Chloride: 98 mmol/L (ref 98–111)
Creatinine, Ser: 0.66 mg/dL (ref 0.44–1.00)
GFR, Estimated: >60 mL/min (ref >60)
Glucose, Bld: 95 mg/dL (ref 70–99)
Potassium: 3.6 mmol/L (ref 3.5–5.1)
Sodium: 132 mmol/L — ABNORMAL LOW (ref 135–145)
Total Bilirubin: 0.5 mg/dL (ref 0.3–1.2)
Total Protein: 8.1 g/dL (ref 6.5–8.1)

## 2021-09-21 LAB — CBC WITH DIFFERENTIAL/PLATELET
Abs Immature Granulocytes: 0.03 10*3/uL (ref 0.00–0.07)
Basophils Absolute: 0.1 10*3/uL (ref 0.0–0.1)
Basophils Relative: 1 %
Eosinophils Absolute: 0.1 10*3/uL (ref 0.0–0.5)
Eosinophils Relative: 1 %
HCT: 35.5 % — ABNORMAL LOW (ref 36.0–46.0)
Hemoglobin: 11.7 g/dL — ABNORMAL LOW (ref 12.0–15.0)
Immature Granulocytes: 0 %
Lymphocytes Relative: 32 %
Lymphs Abs: 2.9 10*3/uL (ref 0.7–4.0)
MCH: 31.2 pg (ref 26.0–34.0)
MCHC: 33 g/dL (ref 30.0–36.0)
MCV: 94.7 fL (ref 80.0–100.0)
Monocytes Absolute: 0.9 10*3/uL (ref 0.1–1.0)
Monocytes Relative: 10 %
Neutro Abs: 5.2 10*3/uL (ref 1.7–7.7)
Neutrophils Relative %: 56 %
Platelets: 545 10*3/uL — ABNORMAL HIGH (ref 150–400)
RBC: 3.75 MIL/uL — ABNORMAL LOW (ref 3.87–5.11)
RDW: 11.9 % (ref 11.5–15.5)
WBC: 9.2 10*3/uL (ref 4.0–10.5)
nRBC: 0 % (ref 0.0–0.2)

## 2021-09-21 LAB — URINE DRUG SCREEN, QUALITATIVE (ARMC ONLY)
Amphetamines, Ur Screen: POSITIVE — AB
Barbiturates, Ur Screen: NOT DETECTED
Benzodiazepine, Ur Scrn: NOT DETECTED
Cannabinoid 50 Ng, Ur ~~LOC~~: NOT DETECTED
Cocaine Metabolite,Ur ~~LOC~~: POSITIVE — AB
MDMA (Ecstasy)Ur Screen: NOT DETECTED
Methadone Scn, Ur: NOT DETECTED
Opiate, Ur Screen: NOT DETECTED
Phencyclidine (PCP) Ur S: NOT DETECTED
Tricyclic, Ur Screen: POSITIVE — AB

## 2021-09-21 LAB — HCG, QUANTITATIVE, PREGNANCY: hCG, Beta Chain, Quant, S: 1 m[IU]/mL (ref <5)

## 2021-09-21 MED ORDER — ULIPRISTAL ACETATE 30 MG PO TABS
30.0000 mg | ORAL_TABLET | Freq: Once | ORAL | Status: AC
  Administered 2021-09-21: 30 mg via ORAL
  Filled 2021-09-21: qty 1
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
## 2021-09-21 NOTE — SANE Note (Signed)
At approximately 10:00 PM, the SANE/FNE Teacher, music) consult has been completed.  The Charge RN and ED Provider, Dr Darnelle Catalan, have been notified.  Please contact the SANE/FNE nurse on call (listed in Amion) with any further concerns.
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
## 2021-09-21 NOTE — ED Notes (Signed)
Awaiting quant HCG results at this time, pt  still needs X Rays.
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
## 2021-10-03 ENCOUNTER — Other Ambulatory Visit: Payer: Self-pay

## 2021-10-03 ENCOUNTER — Ambulatory Visit (INDEPENDENT_AMBULATORY_CARE_PROVIDER_SITE_OTHER): Payer: Medicaid Other | Admitting: Orthopedic Surgery

## 2021-10-03 ENCOUNTER — Ambulatory Visit: Payer: Self-pay

## 2021-10-03 ENCOUNTER — Encounter: Payer: Self-pay | Admitting: Orthopedic Surgery

## 2021-10-03 VITALS — BP 125/81 | HR 106 | Ht 60.0 in | Wt 120.0 lb

## 2021-10-03 DIAGNOSIS — S62617A Displaced fracture of proximal phalanx of left little finger, initial encounter for closed fracture: Secondary | ICD-10-CM

## 2021-10-03 DIAGNOSIS — M79645 Pain in left finger(s): Secondary | ICD-10-CM

## 2021-10-03 DIAGNOSIS — S62619A Displaced fracture of proximal phalanx of unspecified finger, initial encounter for closed fracture: Secondary | ICD-10-CM | POA: Insufficient documentation
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
## 2021-10-08 ENCOUNTER — Other Ambulatory Visit: Payer: Self-pay

## 2021-10-08 ENCOUNTER — Encounter (HOSPITAL_BASED_OUTPATIENT_CLINIC_OR_DEPARTMENT_OTHER): Payer: Self-pay | Admitting: Orthopedic Surgery

## 2021-10-20 ENCOUNTER — Ambulatory Visit (HOSPITAL_BASED_OUTPATIENT_CLINIC_OR_DEPARTMENT_OTHER): Admission: RE | Admit: 2021-10-20 | Payer: Medicaid Other | Source: Home / Self Care | Admitting: Orthopedic Surgery

## 2021-10-20 ENCOUNTER — Encounter (HOSPITAL_COMMUNITY): Payer: Self-pay | Admitting: Certified Registered"

## 2021-10-20 ENCOUNTER — Telehealth: Payer: Self-pay | Admitting: Orthopedic Surgery

## 2021-10-20 SURGERY — OPEN REDUCTION INTERNAL FIXATION (ORIF) METACARPAL
Anesthesia: Choice | Site: Finger | Laterality: Left

## 2021-10-20 MED ORDER — LIDOCAINE HCL (PF) 1 % IJ SOLN
INTRAMUSCULAR | Status: AC
  Filled 2021-10-20: qty 30

## 2021-10-20 NOTE — Telephone Encounter (Signed)
Patient calling requesting to cancel surgery. Patient states Rehab called to let her know they have a bed ready for her today. Patient would like to know if you are willing to do surgery at a later date, or if it would be beneficial to due surgery at a later date? Please call patient ASAP to discuss canceling surgery for today.

## 2021-10-30 ENCOUNTER — Encounter: Payer: Medicaid Other | Admitting: Orthopedic Surgery

## 2021-11-23 ENCOUNTER — Other Ambulatory Visit: Payer: Self-pay

## 2021-11-23 ENCOUNTER — Emergency Department
Admission: EM | Admit: 2021-11-23 | Discharge: 2021-11-23 | Disposition: A | Discharge status: Home / Self Care | Payer: Medicaid Other | Attending: Emergency Medicine | Admitting: Emergency Medicine

## 2021-11-23 DIAGNOSIS — T50901A Poisoning by unspecified drugs, medicaments and biological substances, accidental (unintentional), initial encounter: Secondary | ICD-10-CM | POA: Diagnosis not present

## 2021-11-23 DIAGNOSIS — X58XXXA Exposure to other specified factors, initial encounter: Secondary | ICD-10-CM | POA: Insufficient documentation

## 2021-11-23 MED ORDER — NALOXONE HCL 4 MG/0.1ML NA LIQD: NASAL | 0 refills | Status: DC

## 2021-11-23 NOTE — ED Triage Notes (Addendum)
Pt arrives via EMS for on OD on xanax- pt was given 2mg  of narcan after her O2 sats were originally 40% on RA- pt now A&O x4, O2 sats 100% on RA- pt tearful- pt denies any SI, pt unsure how much she took- pt states they were not a prescription and they she bought them from someone else, pt states she has never bought pills from this person before ?

## 2021-11-23 NOTE — ED Notes (Signed)
Pt given phone to use 

## 2021-11-23 NOTE — ED Notes (Signed)
Pt refused to be placed on heart monitor ?

## 2021-11-23 NOTE — ED Provider Notes (Addendum)
? ?[facility] ?Provider Note ? ? ? Event Date/Time  ? First MD Initiated Contact with Patient 11/23/21 1238   ?  (approximate) ? ? ?History  ? ?Drug Overdose ? ? ?HPI ? ?Kristina Gay is a 21 y.o. female with a past medical history of benzo abuse stating she takes Xanax as intermittently but not daily to get high who presents for evaluation after an apparent overdose.  Patient was transported by EMS who found patient minimally responsive with SPO2 on room air of 40%.  She was given 2 mg of Narcan with improvement to SPO2 of 100% patient alert and oriented x4.  Patient states this happened once before over a month ago and she suspects there was some fentanyl mixed with the Xanax she thought she was taking.  She states she felt in her normal state health before this happened without any breath or recent fevers, chills, cough, nausea, vomiting, diarrhea, rash, recent injury, vomiting, urinary symptoms or other acute sick symptoms.  She states she has no suicidal thoughts and does not want hurt anybody else and does not experience hallucinations.  She has never sought help for this because she does not feel she is addicted and only takes Xanax intermittently sometimes get high.  She denies any other acute concerns at this time. ? ?  ?History reviewed. No pertinent past medical history. ? ? ?Physical Exam  ?Triage Vital Signs: ?ED Triage Vitals  ?Enc Vitals Group  ?   BP --   ?   Pulse Rate 11/23/21 1234 (!) 116  ?   Resp 11/23/21 1234 20  ?   Temp 11/23/21 1234 97.7 ?F (36.5 ?C)  ?   Temp Source 11/23/21 1234 Oral  ?   SpO2 11/23/21 1234 100 %  ?   Weight 11/23/21 1235 120 lb (54.4 kg)  ?   Height 11/23/21 1235 5\' 5"  (1.651 m)  ?   Head Circumference --   ?   Peak Flow --   ?   Pain Score 11/23/21 1234 0  ?   Pain Loc --   ?   Pain Edu? --   ?   Excl. in [place]? --   ? ? ?Most recent vital signs: ?Vitals:  ? 11/23/21 1300 11/23/21 1330  ?BP: 119/78 119/61  ?Pulse: (!) 109 (!) 116  ?Resp:  20   ?Temp:    ?SpO2: 100% 98%  ? ? ?General: Awake, no distress.  ?CV:  Good peripheral perfusion.  2+ radial pulse.  Slightly tachycardic. ?Resp:  Normal effort.  ?Abd:  No distention.  ? ? ? ?ED Results / Procedures / Treatments  ?Labs ?(all labs ordered are listed, but only abnormal results are displayed) ?Labs Reviewed - No data to display ? ? ?EKG ? ? ? ?RADIOLOGY ? ? ? ?PROCEDURES: ? ?Critical Care performed: No ? ?.1-3 Lead EKG Interpretation ?Performed by: Lucrezia Starch, MD ?Authorized by: Lucrezia Starch, MD  ? ?  Interpretation: non-specific   ?  ECG rate assessment: tachycardic   ?  Rhythm: sinus rhythm   ?  Ectopy: none   ?  Conduction: normal   ? ?The patient is on the cardiac monitor to evaluate for evidence of arrhythmia and/or significant heart rate changes. ? ? ?MEDICATIONS ORDERED IN ED: ?Medications - No data to display ? ? ?IMPRESSION / MDM / ASSESSMENT AND PLAN / ED COURSE  ?I reviewed the triage vital signs and the nursing notes. ?             ?               ? ? ?  Patient presents for evaluation after suspected overdose.  Patient states he does not remember exactly what happened.  She has no acute complaints at this time and apparently responded to Narcan with low SPO2 and some decreased mental status.  On arrival she is awake and oriented x4 with no complaints.  I have a low suspicion for trauma, acute infectious process, significant metabolic derangement and her history and exam is consistent with an accidental overdose.  I suspect given response to Narcan that there was a component of opioid mixed with what she thought was a benzo.  It is possible it was also benzodiazepines in this.  Given she required Narcan was hypoxic we will plan to observe for 3 hours.  She does not wish to get help for her substance use at this time.  Will write Rx for Narcan.  Will observe on end-tidal. ? ?Patient observed for 3 hours without requiring any additional Narcan or evidence of hypoxia.  On reassessment  she is alert and denying any other acute complaints.  At this point I think she is stable for discharge with outpatient follow-up.  Discussed extensive dangers of illicit drug use or taking prescription drugs or not prescribed to her.  Counseled on avoiding these in the future.  Discharged in stable condition. ?  ? ? ?FINAL CLINICAL IMPRESSION(S) / ED DIAGNOSES  ? ?Final diagnoses:  ?Accidental overdose, initial encounter  ? ? ? ?Rx / DC Orders  ? ?ED Discharge Orders   ? ?      Ordered  ?  naloxone (NARCAN) nasal spray 4 mg/0.1 mL       ? 11/23/21 1253  ? ?  ?  ? ?  ? ? ? ?Note:  This document was prepared using Dragon voice recognition software and may include unintentional dictation errors. ?  ?Lucrezia Starch, MD ?11/23/21 1423 ? ?  ?Lucrezia Starch, MD ?11/23/21 1526 ? ?

## 2021-12-01 ENCOUNTER — Encounter: Payer: Self-pay | Admitting: Emergency Medicine

## 2021-12-01 ENCOUNTER — Other Ambulatory Visit: Payer: Self-pay

## 2021-12-01 ENCOUNTER — Emergency Department
Admission: EM | Admit: 2021-12-01 | Discharge: 2021-12-02 | Disposition: A | Discharge status: Home / Self Care | Payer: Medicaid Other | Attending: Emergency Medicine | Admitting: Emergency Medicine

## 2021-12-01 DIAGNOSIS — Y908 Blood alcohol level of 240 mg/100 ml or more: Secondary | ICD-10-CM | POA: Diagnosis not present

## 2021-12-01 DIAGNOSIS — F10129 Alcohol abuse with intoxication, unspecified: Secondary | ICD-10-CM | POA: Insufficient documentation

## 2021-12-01 DIAGNOSIS — F1914 Other psychoactive substance abuse with psychoactive substance-induced mood disorder: Secondary | ICD-10-CM | POA: Insufficient documentation

## 2021-12-01 DIAGNOSIS — F1021 Alcohol dependence, in remission: Secondary | ICD-10-CM | POA: Diagnosis present

## 2021-12-01 DIAGNOSIS — F1994 Other psychoactive substance use, unspecified with psychoactive substance-induced mood disorder: Secondary | ICD-10-CM | POA: Diagnosis not present

## 2021-12-01 DIAGNOSIS — Z046 Encounter for general psychiatric examination, requested by authority: Secondary | ICD-10-CM | POA: Diagnosis present

## 2021-12-01 DIAGNOSIS — F1010 Alcohol abuse, uncomplicated: Secondary | ICD-10-CM

## 2021-12-01 DIAGNOSIS — R451 Restlessness and agitation: Secondary | ICD-10-CM | POA: Diagnosis not present

## 2021-12-01 DIAGNOSIS — F1410 Cocaine abuse, uncomplicated: Secondary | ICD-10-CM | POA: Diagnosis not present

## 2021-12-01 DIAGNOSIS — Z20822 Contact with and (suspected) exposure to covid-19: Secondary | ICD-10-CM | POA: Insufficient documentation

## 2021-12-01 DIAGNOSIS — R4689 Other symptoms and signs involving appearance and behavior: Secondary | ICD-10-CM

## 2021-12-01 LAB — COMPREHENSIVE METABOLIC PANEL
ALT: 18 U/L (ref 0–44)
AST: 28 U/L (ref 15–41)
Albumin: 4.6 g/dL (ref 3.5–5.0)
Alkaline Phosphatase: 108 U/L (ref 38–126)
Anion gap: 9 (ref 5–15)
BUN: 7 mg/dL (ref 6–20)
CO2: 22 mmol/L (ref 22–32)
Calcium: 8.6 mg/dL — ABNORMAL LOW (ref 8.9–10.3)
Chloride: 110 mmol/L (ref 98–111)
Creatinine, Ser: 0.64 mg/dL (ref 0.44–1.00)
GFR, Estimated: >60 mL/min (ref >60)
Glucose, Bld: 114 mg/dL — ABNORMAL HIGH (ref 70–99)
Potassium: 3.6 mmol/L (ref 3.5–5.1)
Sodium: 141 mmol/L (ref 135–145)
Total Bilirubin: 0.4 mg/dL (ref 0.3–1.2)
Total Protein: 8.4 g/dL — ABNORMAL HIGH (ref 6.5–8.1)

## 2021-12-01 LAB — CBC
HCT: 42.5 % (ref 36.0–46.0)
Hemoglobin: 13.6 g/dL (ref 12.0–15.0)
MCH: 29.2 pg (ref 26.0–34.0)
MCHC: 32 g/dL (ref 30.0–36.0)
MCV: 91.4 fL (ref 80.0–100.0)
Platelets: 405 10*3/uL — ABNORMAL HIGH (ref 150–400)
RBC: 4.65 MIL/uL (ref 3.87–5.11)
RDW: 13 % (ref 11.5–15.5)
WBC: 7 10*3/uL (ref 4.0–10.5)
nRBC: 0 % (ref 0.0–0.2)

## 2021-12-01 LAB — RESP PANEL BY RT-PCR (FLU A&B, COVID) ARPGX2
Influenza A by PCR: NEGATIVE
Influenza B by PCR: NEGATIVE
SARS Coronavirus 2 by RT PCR: NEGATIVE

## 2021-12-01 LAB — ETHANOL: Alcohol, Ethyl (B): 316 mg/dL (ref <10)

## 2021-12-01 MED ORDER — LORAZEPAM 1 MG PO TABS: 1.0000 mg | ORAL_TABLET | ORAL | Status: DC | PRN

## 2021-12-01 MED ORDER — DIPHENHYDRAMINE HCL 50 MG/ML IJ SOLN
INTRAMUSCULAR | Status: AC
  Administered 2021-12-01: 50 mg via INTRAMUSCULAR
  Filled 2021-12-01: qty 1

## 2021-12-01 MED ORDER — FOLIC ACID 1 MG PO TABS
1.0000 mg | ORAL_TABLET | Freq: Every day | ORAL | Status: DC
  Administered 2021-12-02: 1 mg via ORAL
  Filled 2021-12-01: qty 1

## 2021-12-01 MED ORDER — LORAZEPAM 2 MG/ML IJ SOLN: 1.0000 mg | INTRAMUSCULAR | Status: DC | PRN

## 2021-12-01 MED ORDER — THIAMINE HCL 100 MG/ML IJ SOLN: 100.0000 mg | Freq: Every day | INTRAMUSCULAR | Status: DC

## 2021-12-01 MED ORDER — ADULT MULTIVITAMIN W/MINERALS CH
1.0000 | ORAL_TABLET | Freq: Every day | ORAL | Status: DC
  Administered 2021-12-02: 1 via ORAL
  Filled 2021-12-01: qty 1

## 2021-12-01 MED ORDER — THIAMINE HCL 100 MG PO TABS
100.0000 mg | ORAL_TABLET | Freq: Every day | ORAL | Status: DC
  Administered 2021-12-02: 100 mg via ORAL
  Filled 2021-12-01: qty 1

## 2021-12-01 MED ORDER — LORAZEPAM 2 MG/ML IJ SOLN
INTRAMUSCULAR | Status: AC
  Administered 2021-12-01: 2 mg via INTRAMUSCULAR
  Filled 2021-12-01: qty 1

## 2021-12-01 MED ORDER — HALOPERIDOL LACTATE 5 MG/ML IJ SOLN: 5.0000 mg | Freq: Once | INTRAMUSCULAR | Status: AC

## 2021-12-01 MED ORDER — DIPHENHYDRAMINE HCL 50 MG/ML IJ SOLN
50.0000 mg | Freq: Once | INTRAMUSCULAR | Status: AC
Start: 2021-12-01 — End: 2021-12-01

## 2021-12-01 MED ORDER — HALOPERIDOL LACTATE 5 MG/ML IJ SOLN
INTRAMUSCULAR | Status: AC
  Administered 2021-12-01: 5 mg via INTRAMUSCULAR
  Filled 2021-12-01: qty 1

## 2021-12-01 MED ORDER — LORAZEPAM 2 MG/ML IJ SOLN: 2.0000 mg | Freq: Once | INTRAMUSCULAR | Status: AC
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
## 2021-12-01 NOTE — BH Assessment (Signed)
TTS not able to interview patient at this time. Patient was given IM for agitation. Will try back again later today.  ?

## 2021-12-01 NOTE — ED Notes (Signed)
Patient's mother is at the bedside.  ?
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
## 2021-12-01 NOTE — ED Notes (Signed)
Critical result: Dr. Kerman Passey notified of critical ETOH result of 316. No additional orders at this time.  ?

## 2021-12-01 NOTE — ED Notes (Signed)
Pt drowsy,sleeping.  Meds not given at this time ?

## 2021-12-01 NOTE — ED Notes (Signed)
Dinner tray given. Patient resting at this time. ?

## 2021-12-01 NOTE — ED Notes (Signed)
IVC consult unable to be done patient sedated  ?

## 2021-12-01 NOTE — ED Notes (Signed)
Dressed out with [employer], Child psychotherapist ?Red jacket ?Black Leggings  ?White Socks  ?Pink underwear  ?Rings (in cup) ?Bracelets  ?

## 2021-12-01 NOTE — ED Notes (Signed)
Patient is resting comfortably. Lunch tray placed in room.  ?

## 2021-12-01 NOTE — ED Notes (Signed)
Patient urinated on bed while this RN was attempting to obtain medication orders for patient's aggressive behavior. Unable to collect urine sample d/t behavior and urinating on bed.  ?

## 2021-12-01 NOTE — ED Notes (Signed)
Patient continues to be very sleepy from medications earlier today, but able to respond when spoken to.  ?

## 2021-12-01 NOTE — ED Notes (Signed)
Resumed care from jen I rn  pt sleeping ?

## 2021-12-01 NOTE — ED Notes (Signed)
IVC pending consult   

## 2021-12-01 NOTE — ED Notes (Signed)
Spoke to patient's mother who states patient has been having problems with ETOH for last 4.5 years, stopped last February but has since resumed drinking. Patient has been "smoking crack since last year". Mother also reports patient has verbalized that she "doesn't want to live" to her through calls and text, but tells mother she will continue to live because of her mother and wanting to see her father that is currently in prison. Mother also reports patient has been doing a large number of drugs recently ("Acid, LSD, Cocaine, Crack") and that she was seen in ER one week ago for alleged overdose on Xanax that was actually overdose on Fentanyl. Mother denies any known psych diagnosis other than depression and anxiety, but believes patient is bipolar and reports extreme highs and lows for patient (no formal diagnosis). Mother had patient taken to jail on Saturday for a known warrant to try to keep patient safe.  ?
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
## 2021-12-02 DIAGNOSIS — F1410 Cocaine abuse, uncomplicated: Secondary | ICD-10-CM

## 2021-12-02 LAB — URINE DRUG SCREEN, QUALITATIVE (ARMC ONLY)
Amphetamines, Ur Screen: NOT DETECTED
Barbiturates, Ur Screen: NOT DETECTED
Benzodiazepine, Ur Scrn: POSITIVE — AB
Cannabinoid 50 Ng, Ur ~~LOC~~: NOT DETECTED
Cocaine Metabolite,Ur ~~LOC~~: POSITIVE — AB
MDMA (Ecstasy)Ur Screen: NOT DETECTED
Methadone Scn, Ur: NOT DETECTED
Opiate, Ur Screen: NOT DETECTED
Phencyclidine (PCP) Ur S: NOT DETECTED
Tricyclic, Ur Screen: NOT DETECTED

## 2021-12-02 NOTE — ED Notes (Signed)
IVC PAPERS  RESCINDED PER  DR  CLAPACS MD  INFORMED  KATIE  RN 

## 2021-12-02 NOTE — ED Notes (Signed)
TOC at bedside 
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
## 2021-12-02 NOTE — ED Notes (Signed)
Pt was awakened for vitals, Ms Hakimian remains calm and cooperative.  Pt preformed ADL's after vitals then requested juice that was provided.  Pt stated that she was unclear about the situation surrounding her admission.  Staff oriented pt to place/situation.  Pt then requested the telephone, staff informed pt that phones are not available in the [facility] until after 0900.  A copy of the pt rules and guidelines were provided.  Pt remained calm and cooperative during interaction.  ?

## 2021-12-02 NOTE — BH Assessment (Signed)
This Clinical research associate attempted to assess pt along side psych NP Annice Pih T. Pt woke up briefly, but dozed back off immediately. TTS to follow up. ?

## 2021-12-02 NOTE — ED Notes (Signed)
Hospital breakfast meal provided with milk.  Pt continues to appear sedated and falls asleep quickly after speaking with staff.  Tray left in pt room ?

## 2021-12-02 NOTE — ED Notes (Signed)
Patient given lunch tray. Pt also helped clean up trash in room. ?

## 2021-12-02 NOTE — ED Notes (Signed)
Psych MD at bedside

## 2021-12-02 NOTE — Progress Notes (Signed)
The patient was given IM for agitation. The psych team is unable to assess the patient at this time. We will continue to attempt to get her assessment done once she can entirely participate in the assessment process. ?

## 2021-12-02 NOTE — ED Notes (Signed)
IVC/ Unable to perform Consult due to sedation ?

## 2021-12-02 NOTE — TOC Initial Note (Signed)
Transition of Care (TOC) - Initial/Assessment Note  ? ? ?Patient Details  ?Name: Kristina Gay ?MRN: [id number] ?Date of Birth: Apr 06, 2001 ? ?Transition of Care (TOC) CM/SW Contact:    ?Shelbie Hutching, RN ?Phone Number: ?12/02/2021, 9:39 AM ? ?Clinical Narrative:                 ?Patient brought into the emergency department under IVC for aggressive behavior, alcohol intoxication, and also found to be positive for cocaine.   ?RNCM met with patient at the bedside this morning.  Patient is calm and cooperative, she reports that alcohol is why she is here. ?Asked patient if she thought she had a problem with substances, she reports she will get help.  RNCM provided patient with a list of resources, inpatient and outpatient for substance abuse treatment. ?Patient does not drive but she reports that her boyfriend, Larene Beach does and he can take her to appointments or treatments.  She lives with Larene Beach, she says that he can pick her up today from the hospital.   ? ?Expected Discharge Plan: Home/Self Care ?Barriers to Discharge: Barriers Resolved ? ? ?Patient Goals and CMS Choice ?Patient states their goals for this hospitalization and ongoing recovery are:: wants to go home ?  ?  ? ?Expected Discharge Plan and Services ?Expected Discharge Plan: Home/Self Care ?  ?Discharge Planning Services: CM Consult ?  ?Living arrangements for the past 2 months: [place] ?                ?DME Arranged: N/A ?DME Agency: NA ?  ?  ?  ?HH Arranged: NA ?[place] Agency: NA ?  ?  ?  ? ?Prior Living Arrangements/Services ?Living arrangements for the past 2 months: [place] ?Lives with:: Significant Other ?Patient language and need for interpreter reviewed:: Yes ?Do you feel safe going back to the place where you live?: Yes      ?Need for Family Participation in Patient Care: Yes (Comment) ?Care giver support system in place?: Yes (comment) (mother and boyfriend) ?  ?Criminal Activity/Legal Involvement Pertinent to Current  Situation/Hospitalization: No - Comment as needed ? ?Activities of Daily Living ?  ?  ? ?Permission Sought/Granted ?  ?Permission granted to share information with : No ?   ?   ?   ?   ? ?Emotional Assessment ?Appearance:: Appears stated age ?Attitude/Demeanor/Rapport: Mercy Moore ?Affect (typically observed): Accepting ?Orientation: : Oriented to Self, Oriented to Place, Oriented to  Time, Oriented to Situation ?Alcohol / Substance Use: Illicit Drugs, Alcohol Use ?Psych Involvement: No (comment) ? ?Admission diagnosis:  IVC ?Patient Active Problem List  ? Diagnosis Date Noted  ? Proximal phalanx fracture of finger 10/03/2021  ? Acetabular fracture ([place]) 09/03/2021  ? Substance induced mood disorder ([place]) 03/04/2018  ? Adjustment disorder with mixed anxiety and depressed mood 01/17/2018  ? Benzodiazepine overdose 01/14/2018  ? Alcohol abuse 01/14/2018  ? Mood disorder ([place]) 01/14/2018  ? ?PCP:  Burnett Harry, MD ?Pharmacy:   ?[facility] [place] ?[place] [address] ?Phone: [phone] Fax: [phone] ? ?[facility] ?[address] ?[place] [address] ?Phone: [phone] Fax: [phone] ? ? ? ? ?Social Determinants of Health (SDOH) Interventions ?  ? ?Readmission Risk Interventions ?No flowsheet data found. ? ? ?

## 2021-12-02 NOTE — Consult Note (Signed)
[facility] Face-to-Face Psychiatry Consult  ? ?Reason for Consult: Consult for 21 year old woman came to the emergency room on the 13th agitated aggressive intoxicated ?Referring Physician: Paduchowski ?Patient Identification: Kristina Gay ?MRN:  [id number] ?Principal Diagnosis: Substance induced mood disorder ([place]) ?Diagnosis:  Principal Problem: ?  Substance induced mood disorder ([place]) ?Active Problems: ?  Alcohol abuse ?  Cocaine abuse ([place]) ? ? ?Total Time spent with patient: 45 minutes ? ?Subjective:   ?Kristina Gay is a 21 y.o. female patient admitted with "I am okay now". ? ?HPI: Patient seen and chart reviewed.  21 year old woman with a history of polysubstance abuse came to the emergency room on the 13th.  She was aggressive and agitated with reports that she had been punching empty cars running around in the street with no shoes on.  Patient was obviously intoxicated on presentation with a blood alcohol level over 300.  Drug screen positive for cocaine.  Patient required sedation for safety in the emergency room more than once.  Finally woke up today and was amenable to evaluation.  Reports that she is feeling better now.  Has only vague memories of what happened before she came in but was aware that she had been drinking heavily.  Patient is denying any hallucinations.  Does not have any signs or symptoms of alcohol withdrawal.  Denies suicidal or homicidal ideation.  She has expressed multiple times that she has no interest in engaging in any substance abuse treatment. ? ?Past Psychiatric History: Past history of abuse of alcohol cocaine and amphetamines multiple drugs.  Several presentations to the emergency room before.  No history of DTs or seizures. ? ?Risk to Self:   ?Risk to Others:   ?Prior Inpatient Therapy:   ?Prior Outpatient Therapy:   ? ?Past Medical History: History reviewed. No pertinent past medical history. History reviewed. No pertinent surgical history. ?Family History: History  reviewed. No pertinent family history. ?Family Psychiatric  History: None reported ?Social History:  ?Social History  ? ?Substance and Sexual Activity  ?Alcohol Use Yes  ? Alcohol/week: 4.0 standard drinks  ? Types: 4 Cans of beer per week  ?   ?Social History  ? ?Substance and Sexual Activity  ?Drug Use Yes  ? Types: Marijuana, Cocaine  ? Comment: occasionally  ?  ?Social History  ? ?Socioeconomic History  ? Marital status: Single  ?  Spouse name: Not on file  ? Number of children: Not on file  ? Years of education: Not on file  ? Highest education level: Not on file  ?Occupational History  ? Not on file  ?Tobacco Use  ? Smoking status: Every Day  ?  Types: Cigarettes  ? Smokeless tobacco: Never  ?Vaping Use  ? Vaping Use: Every day  ?Substance and Sexual Activity  ? Alcohol use: Yes  ?  Alcohol/week: 4.0 standard drinks  ?  Types: 4 Cans of beer per week  ? Drug use: Yes  ?  Types: Marijuana, Cocaine  ?  Comment: occasionally  ? Sexual activity: Yes  ?Other Topics Concern  ? Not on file  ?Social History Narrative  ? Not on file  ? ?Social Determinants of Health  ? ?Financial Resource Strain: Not on file  ?Food Insecurity: Not on file  ?Transportation Needs: Not on file  ?Physical Activity: Not on file  ?Stress: Not on file  ?Social Connections: Not on file  ? ?Additional Social History: ?  ? ?Allergies:  No Known Allergies ? ?Labs:  ?Results for orders placed  or performed during the hospital encounter of 12/01/21 (from the past 48 hour(s))  ?Comprehensive metabolic panel     Status: Abnormal  ? Collection Time: 12/01/21 10:20 AM  ?Result Value Ref Range  ? Sodium 141 135 - 145 mmol/L  ? Potassium 3.6 3.5 - 5.1 mmol/L  ? Chloride 110 98 - 111 mmol/L  ? CO2 22 22 - 32 mmol/L  ? Glucose, Bld 114 (H) 70 - 99 mg/dL  ?  Comment: Glucose reference range applies only to samples taken after fasting for at least 8 hours.  ? BUN 7 6 - 20 mg/dL  ? Creatinine, Ser 0.64 0.44 - 1.00 mg/dL  ? Calcium 8.6 (L) 8.9 - 10.3 mg/dL  ?  Total Protein 8.4 (H) 6.5 - 8.1 g/dL  ? Albumin 4.6 3.5 - 5.0 g/dL  ? AST 28 15 - 41 U/L  ? ALT 18 0 - 44 U/L  ? Alkaline Phosphatase 108 38 - 126 U/L  ? Total Bilirubin 0.4 0.3 - 1.2 mg/dL  ? GFR, Estimated >60 >60 mL/min  ?  Comment: (NOTE) ?Calculated using the CKD-EPI Creatinine Equation (2021) ?  ? Anion gap 9 5 - 15  ?  Comment: Performed at [facility], [address] [place], [place] [address]  ?Ethanol     Status: Abnormal  ? Collection Time: 12/01/21 10:20 AM  ?Result Value Ref Range  ? Alcohol, Ethyl (B) 316 (HH) <10 mg/dL  ?  Comment: CRITICAL RESULT CALLED TO, READ BACK BY AND VERIFIED WITH ?JEENNIFER INGERSOLL AT 1110 ON 12/01/21 LR ?(NOTE) ?Lowest detectable limit for serum alcohol is 10 mg/dL. ? ?For medical purposes only. ?Performed at [facility], [place], ?[place] [address] ?  ?cbc     Status: Abnormal  ? Collection Time: 12/01/21 10:20 AM  ?Result Value Ref Range  ? WBC 7.0 4.0 - 10.5 K/uL  ? RBC 4.65 3.87 - 5.11 MIL/uL  ? Hemoglobin 13.6 12.0 - 15.0 g/dL  ? HCT 42.5 36.0 - 46.0 %  ? MCV 91.4 80.0 - 100.0 fL  ? MCH 29.2 26.0 - 34.0 pg  ? MCHC 32.0 30.0 - 36.0 g/dL  ? RDW 13.0 11.5 - 15.5 %  ? Platelets 405 (H) 150 - 400 K/uL  ? nRBC 0.0 0.0 - 0.2 %  ?  Comment: Performed at [facility], [address], [place], [place] [address]  ?Resp Panel by RT-PCR (Flu A&B, Covid) Nasopharyngeal Swab     Status: None  ? Collection Time: 12/01/21 11:59 AM  ? Specimen: Nasopharyngeal Swab; Nasopharyngeal(NP) swabs in vial transport medium  ?Result Value Ref Range  ? SARS Coronavirus 2 by RT PCR NEGATIVE NEGATIVE  ?  Comment: (NOTE) ?SARS-CoV-2 target nucleic acids are NOT DETECTED. ? ?The SARS-CoV-2 RNA is generally detectable in upper respiratory ?specimens during the acute phase of infection. The lowest ?concentration of SARS-CoV-2 viral copies this assay can detect is ?138 copies/mL. A negative result does not preclude SARS-Cov-2 ?infection and should not be  used as the sole basis for treatment or ?other patient management decisions. A negative result may occur with  ?improper specimen collection/handling, submission of specimen other ?than nasopharyngeal swab, presence of viral mutation(s) within the ?areas targeted by this assay, and inadequate number of viral ?copies(<138 copies/mL). A negative result must be combined with ?clinical observations, patient history, and epidemiological ?information. The expected result is Negative. ? ?Fact Sheet for Patients:  ?EntrepreneurPulse.com.au ? ?Fact Sheet for Healthcare Providers:  ?IncredibleEmployment.be ? ?  This test is no t yet approved or cleared by the [place] FDA and  ?has been authorized for detection and/or diagnosis of SARS-CoV-2 by ?FDA under an Emergency Use Authorization (EUA). This EUA will remain  ?in effect (meaning this test can be used) for the duration of the ?COVID-19 declaration under Section 564(b)(1) of the Act, 21 ?U.S.C.section 360bbb-3(b)(1), unless the authorization is terminated  ?or revoked sooner.  ? ? ?  ? Influenza A by PCR NEGATIVE NEGATIVE  ? Influenza B by PCR NEGATIVE NEGATIVE  ?  Comment: (NOTE) ?The Xpert Xpress SARS-CoV-2/FLU/RSV plus assay is intended as an aid ?in the diagnosis of influenza from Nasopharyngeal swab specimens and ?should not be used as a sole basis for treatment. Nasal washings and ?aspirates are unacceptable for Xpert Xpress SARS-CoV-2/FLU/RSV ?testing. ? ?Fact Sheet for Patients: ?EntrepreneurPulse.com.au ? ?Fact Sheet for Healthcare Providers: ?IncredibleEmployment.be ? ?This test is not yet approved or cleared by the [place] FDA and ?has been authorized for detection and/or diagnosis of SARS-CoV-2 by ?FDA under an Emergency Use Authorization (EUA). This EUA will remain ?in effect (meaning this test can be used) for the duration of the ?COVID-19 declaration under Section 564(b)(1) of the  Act, 21 U.S.C. ?section 360bbb-3(b)(1), unless the authorization is terminated or ?revoked. ? ?Performed at [facility], [place], ?[place] [address] ?  ?Urine Drug Screen, Sander Nephew

## 2021-12-02 NOTE — ED Notes (Signed)
TTS at bedside. 

## 2021-12-02 NOTE — BH Assessment (Signed)
Comprehensive Clinical Assessment (CCA) Screening, Triage and Referral Note ? ?12/02/2021 ?Kristina Gay ?[id number] ? ?Kristina Gay, 21 year old female who presents to [facility] ED involuntarily for treatment. Per triage note, Pt to ED via [employer] with IVC papers. Pt has history of Heroin, crack and cocaine abuse. She was found outside barefoot and punching occupied vehicles. Aggressive with LEO  ? ?During TTS assessment pt presents alert and oriented x 4, sleepy but cooperative, and mood-congruent with affect. The pt does not appear to be responding to internal or external stimuli. Neither is the pt presenting with any delusional thinking. Pt verified the information provided to triage RN.  ? ?Pt identifies her main complaint to be that she ?was drunk and does not remember or care what happened.? Patient reports she drank 1/5th of tequila and uses cocaine daily. ?I don?t drink every day so; it is not an issue.? Patient reports she has pending charges and will seek rehab when it is ordered by the court. ?They decide where I have to go.? Pt denies having INPT or OPT hx. Patient is not currently taking any medication and is not interested in detox or rehab. Patient?s eyes were closed throughout the entire assessment. Patient states she is tired and wants to get some rest. Pt denies SI/HI/AH/VH.   ? ?Pt provided Shannon (fianc?) [phone] as a collateral contact. Carollee Herter reports patient was scheduled to see her lawyer a few days ago; however, she left the house early in the morning and before he could find out where she was, ?it was too late and she was brought to the ED.? Carollee Herter states patient will more than likely be sentenced to jail and patient does not want to go through withdrawals while incarcerated. "I love her and want the best for her, even if it means she has to go to jail." Carollee Herter states patient is safe to return home and he will come pick her up if necessary.   ? ?Pending disposition  ? ?Chief  Complaint:  ?Chief Complaint  ?Patient presents with  ? Psychiatric Evaluation  ? ?Visit Diagnosis: Substance use disorder ? ?Patient Reported Information ?How did you hear about [place]? -- Mudlogger) ? ?What Is the Reason for Your Visit/Call Today? Patient was brought to ED by the police department for agressive behavior. ? ?How Long Has This Been Causing You Problems? <Week ? ?What Do You Feel Would Help You the Most Today? -- (Assessment only) ? ? ?Have You Recently Had Any Thoughts About Hurting Yourself? No ? ?Are You Planning to Commit Suicide/Harm Yourself At This time? No ? ? ?Have you Recently Had Thoughts About Hurting Someone Karolee Ohs? No ? ?Are You Planning to Harm Someone at This Time? No ? ?Explanation: No data recorded ? ?Have You Used Any Alcohol or Drugs in the Past 24 Hours? Yes ? ?How Long Ago Did You Use Drugs or Alcohol? No data recorded ?What Did You Use and How Much? Alcohol and cocaine ? ? ?Do You Currently Have a Therapist/Psychiatrist? No ? ?Name of Therapist/Psychiatrist: No data recorded ? ?Have You Been Recently Discharged From Any Office Practice or Programs? No ? ?Explanation of Discharge From Practice/Program: No data recorded ?  ?CCA Screening Triage Referral Assessment ?Type of Contact: Face-to-Face ? ?Telemedicine Service Delivery:   ?Is this Initial or Reassessment? No data recorded ?Date Telepsych consult ordered in CHL:  No data recorded ?Time Telepsych consult ordered in CHL:  No data recorded ?Location of Assessment: [facility] ED ? ?Provider Location:  [facility] ED ? ? ?Collateral Involvement: Beverley Fiedler [phone] ? ? ?Does Patient Have a Automotive engineer Guardian? No data recorded ?Name and Contact of Legal Guardian: No data recorded ?If Minor and Not Living with Parent(s), Who has Custody? n/a ? ?Is CPS involved or ever been involved? Never ? ?Is APS involved or ever been involved? Never ? ? ?Patient Determined To Be At Risk for Harm To Self or Others Based on Review of  Patient Reported Information or Presenting Complaint? No ? ?Method: No data recorded ?Availability of Means: No data recorded ?Intent: No data recorded ?Notification Required: No data recorded ?Additional Information for Danger to Others Potential: No data recorded ?Additional Comments for Danger to Others Potential: No data recorded ?Are There Guns or Other Weapons in Your Home? No data recorded ?Types of Guns/Weapons: No data recorded ?Are These Weapons Safely Secured?                            No data recorded ?Who Could Verify You Are Able To Have These Secured: No data recorded ?Do You Have any Outstanding Charges, Pending Court Dates, Parole/Probation? No data recorded ?Contacted To Inform of Risk of Harm To Self or Others: No data recorded ? ?Does Patient Present under Involuntary Commitment? Yes ? ?IVC Papers Initial File Date: 12/01/21 ? ? ?[place] of Residence: [place] ? ? ?Patient Currently Receiving the Following Services: Not Receiving Services ? ? ?Determination of Need: Urgent (48 hours) ? ? ?Options For Referral: ED Visit; Chemical Dependency Intensive Outpatient Therapy (CDIOP) ? ? ?Discharge Disposition:  ?  ? ?Cliffard Hair Dierdre Searles, Counselor, LCAS-A ? ? ?  ?  ?  ? ? ?

## 2021-12-02 NOTE — ED Notes (Signed)
Pt continues to sleep.

## 2021-12-02 NOTE — ED Notes (Addendum)
Report received from [facility], English as a second language teacher. On initial round after report Pt is warm/dry, resting quietly in room without any s/s of distress.  Will continue to monitor throughout shift as ordered for any changes in behaviors and for continued safety.   ?

## 2021-12-02 NOTE — ED Notes (Signed)
Personal property returned so that patient can be dressed to discharge ?

## 2021-12-02 NOTE — ED Notes (Signed)
During nursing assessment was A/Ox 3 .  .Ms Deraggenried  stated that she does not have thoughts or feelings of SI/HI.  Pt reported that she has never had auditory or visual hallucinations.  Pt affect is appropriate to circumstance , eye contact is good , speech is clear  with appropriate verbiage noted.  Staff addressed any feelings or concerns that have been brought up.  Medications were administered as ordered. Continue to monitor patient as ordered for any changes in behaviors and for continued safety. Pt continues to inquire as to when she will be discharged, pt requesting to speak to ED MD.  Psych MD informed ? ?

## 2023-03-08 ENCOUNTER — Encounter: Payer: Self-pay | Admitting: *Deleted

## 2023-03-08 ENCOUNTER — Emergency Department
Admission: EM | Admit: 2023-03-08 | Discharge: 2023-03-09 | Payer: Medicaid Other | Attending: Emergency Medicine | Admitting: Emergency Medicine

## 2023-03-08 ENCOUNTER — Emergency Department: Payer: Medicaid Other

## 2023-03-08 ENCOUNTER — Other Ambulatory Visit: Payer: Self-pay

## 2023-03-08 DIAGNOSIS — W25XXXA Contact with sharp glass, initial encounter: Secondary | ICD-10-CM | POA: Insufficient documentation

## 2023-03-08 DIAGNOSIS — S59911A Unspecified injury of right forearm, initial encounter: Secondary | ICD-10-CM | POA: Diagnosis present

## 2023-03-08 DIAGNOSIS — S51811A Laceration without foreign body of right forearm, initial encounter: Secondary | ICD-10-CM | POA: Insufficient documentation

## 2023-03-08 MED ORDER — LIDOCAINE-EPINEPHRINE (PF) 2 %-1:200000 IJ SOLN
10.0000 mL | Freq: Once | INTRAMUSCULAR | Status: AC
  Administered 2023-03-08: 10 mL
  Filled 2023-03-08: qty 20

## 2023-03-08 NOTE — ED Provider Notes (Incomplete)
[facility] Emergency Department Provider Note     Event Date/Time   First MD Initiated Contact with Patient 03/08/23 2216     (approximate)   History   Laceration   HPI  Shayli Andujo is a 22 y.o. female with a history of mood disorder presents to the ED accompanied by police for medical clearance of laceration to right forearm.  Patient is in handcuffs and in police custody.  Patient reports being cut by a piece of glass.  Bleeding is controlled at this time.  Denies numbness.  No other complaints.  Physical Exam   Triage Vital Signs: ED Triage Vitals  Enc Vitals Group     BP 03/08/23 2202 103/62     Pulse Rate 03/08/23 2202 (!) 120     Resp 03/08/23 2202 18     Temp 03/08/23 2202 98.7 F (37.1 C)     Temp Source 03/08/23 2202 Oral     SpO2 03/08/23 2202 96 %     Weight 03/08/23 2203 140 lb (63.5 kg)     Height 03/08/23 2203 5\' 1"  (1.549 m)     Head Circumference --      Peak Flow --      Pain Score 03/08/23 2203 0     Pain Loc --      Pain Edu? --      Excl. in GC? --     Most recent vital signs: Vitals:   03/08/23 2202  BP: 103/62  Pulse: (!) 120  Resp: 18  Temp: 98.7 F (37.1 C)  SpO2: 96%    General Awake, no distress.  HEENT NCAT. PERRL. EOMI. No rhinorrhea. Mucous membranes are moist. CV:  Good peripheral perfusion.  RESP:  Normal effort.  ABD:  No distention.  Other:     ED Results / Procedures / Treatments   Labs (all labs ordered are listed, but only abnormal results are displayed) Labs Reviewed - No data to display  EKG  ***  RADIOLOGY  {**I personally viewed and evaluated these images as part of my medical decision making, as well as reviewing the written report by the radiologist.  ED Provider Interpretation: ***  History and physical examination do not warrant a lab work up or imaging at this time. ***  No results found.   PROCEDURES:  Critical Care performed:  {CriticalCareYesNo:19197::"Yes, see critical care procedure note(s)","No"}  Procedures   MEDICATIONS ORDERED IN ED: Medications  lidocaine-EPINEPHrine (XYLOCAINE W/EPI) 2 %-1:200000 (PF) injection 10 mL (10 mLs Infiltration Given 03/08/23 2303)     IMPRESSION / MDM / ASSESSMENT AND PLAN / ED COURSE  I reviewed the triage vital signs and the nursing notes.                                 22 y.o. female presents to the emergency department for evaluation and treatment of ***. See HPI for further details. Vital signs and physical exam are pertinent for ***.   Differential diagnosis includes, but is not limited to ***   {**The patient is on the cardiac monitor to evaluate for evidence of arrhythmia and/or significant heart rate changes.**}  Lab work ordered and reviewed revealing ***   Imaging ordered and reviewed ***   The patient was administered *** resulting in *** of symptoms.  Patient is in satisfactory and stable condition for discharge and outpatient follow up. Patient will be discharged home  with prescriptions for ***. Patient is to follow up with *** as needed or otherwise directed. Patient is given ED precautions to return to the ED for any worsening or new symptoms. Patient verbalizes understanding. All questions and concerns were addressed during ED visit.    Patient's presentation is most consistent with {EM COPA:27473}  FINAL CLINICAL IMPRESSION(S) / ED DIAGNOSES   Final diagnoses:  Laceration of right forearm, initial encounter     Rx / DC Orders   ED Discharge Orders          Ordered    Dermabond  Status:  Canceled        03/08/23 2348             Note:  This document was prepared using Dragon voice recognition software and may include unintentional dictation errors.

## 2023-03-08 NOTE — ED Provider Notes (Addendum)
[facility] Emergency Department Provider Note     Event Date/Time   First MD Initiated Contact with Patient 03/08/23 2216     (approximate)   History   Laceration   HPI  Kristina Gay is a 22 y.o. female with a history of a mood disorder who presents to the ED accompanied by police for medical clearance of laceration to right forearm.  Patient is in handcuffs and in police custody.  Patient is unsure how she got this laceration.  She reports she was fighting with her sister and noted an excessive amount of blood.  She initially thought she was sweating.  Triage note reports patient being cut by a piece of glass. Bleeding is controlled at this time.  Denies numbness.  No other complaints.  Physical Exam   Triage Vital Signs: ED Triage Vitals  Enc Vitals Group     BP 03/08/23 2202 103/62     Pulse Rate 03/08/23 2202 (!) 120     Resp 03/08/23 2202 18     Temp 03/08/23 2202 98.7 F (37.1 C)     Temp Source 03/08/23 2202 Oral     SpO2 03/08/23 2202 96 %     Weight 03/08/23 2203 140 lb (63.5 kg)     Height 03/08/23 2203 5\' 1"  (1.549 m)     Head Circumference --      Peak Flow --      Pain Score 03/08/23 2203 0     Pain Loc --      Pain Edu? --      Excl. in GC? --     Most recent vital signs: Vitals:   03/08/23 2202  BP: 103/62  Pulse: (!) 120  Resp: 18  Temp: 98.7 F (37.1 C)  SpO2: 96%    General Awake, no distress.  HEENT NCAT. PERRL. EOMI. No rhinorrhea. Mucous membranes are moist. CV:  Good peripheral perfusion.  RESP:  Normal effort.  ABD:  No distention.  Other:  3 cm single laceration to right forearm. Subcutaneous tissue exposed. Bleeding controlled.  Neurovascular status intact.   ED Results / Procedures / Treatments   Labs (all labs ordered are listed, but only abnormal results are displayed) Labs Reviewed - No data to display  RADIOLOGY  Patient refused imaging. No results found.  PROCEDURES:  Critical  Care performed: No  ..Laceration Repair  Date/Time: 03/09/2023 1:34 AM  Performed by: Kern Reap A, PA-C Authorized by: Conrad [place], PA-C   Consent:    Consent obtained:  Verbal   Consent given by:  Patient   Risks, benefits, and alternatives were discussed: yes     Risks discussed:  Infection, pain and retained foreign body Anesthesia:    Anesthesia method:  Local infiltration   Local anesthetic:  Lidocaine 2% WITH epi Laceration details:    Location:  Shoulder/arm   Shoulder/arm location:  R lower arm   Length (cm):  3   Depth (mm):  1 Pre-procedure details:    Preparation:  Patient was prepped and draped in usual sterile fashion Exploration:    Hemostasis achieved with:  Epinephrine   Wound exploration: entire depth of wound visualized     Contaminated: no   Treatment:    Area cleansed with:  Povidone-iodine   Amount of cleaning:  Extensive   Irrigation solution:  Sterile saline   Irrigation method:  Syringe Skin repair:    Repair method:  Sutures   Suture size:  4-0  Suture material:  Nylon   Suture technique:  Simple interrupted   Number of sutures:  6 Repair type:    Repair type:  Simple Post-procedure details:    Dressing:  Adhesive bandage   Procedure completion:  Tolerated well, no immediate complications   MEDICATIONS ORDERED IN ED: Medications  lidocaine-EPINEPHrine (XYLOCAINE W/EPI) 2 %-1:200000 (PF) injection 10 mL (10 mLs Infiltration Given 03/08/23 2303)   IMPRESSION / MDM / ASSESSMENT AND PLAN / ED COURSE  I reviewed the triage vital signs and the nursing notes.                               22 y.o. female presents to the emergency department for evaluation and treatment of laceration of her right forearm under police custody. See HPI for further details.    X-rays ordered to rule out foreign body retention, however patient refused imaging if she was not given apple juice or ice cream.  Patient through equipment from x-ray machine at  x-ray technician. No images obtained.  Patient believed after she got imagings she would be discharged and taken to jail.  The laceration was extensively cleaned, I gave patient multiple cups of apple juice and ice cream and she was cooperative during laceration repair as long as she had apple juice.  Patient is in stable condition and medically cleared.  I explained to her and present officer that she will need sutures removed in 7 to 10 days. Patient is given ED precautions to return to the ED for any worsening or new symptoms. Patient verbalizes understanding.   Patient's presentation is most consistent with acute complicated illness / injury requiring diagnostic workup.  FINAL CLINICAL IMPRESSION(S) / ED DIAGNOSES   Final diagnoses:  Laceration of right forearm, initial encounter     Rx / DC Orders   ED Discharge Orders          Ordered    Dermabond  Status:  Canceled        03/08/23 2348             Note:  This document was prepared using Dragon voice recognition software and may include unintentional dictation errors.    Romeo Apple, Shamonica Schadt A, PA-C 03/09/23 0134    Romeo Apple, Jyll Tomaro A, PA-C 03/09/23 1610    Georga Hacking, MD 03/09/23 2238

## 2023-03-08 NOTE — ED Notes (Signed)
Pt given apple juice at request of [employer] officer

## 2023-03-08 NOTE — Discharge Instructions (Signed)
Suture Removal 10-14 days

## 2023-03-08 NOTE — ED Triage Notes (Addendum)
Pt has a laceration to right forearm. Pt was cut with a piece of glass  Bleeding controlled. Pt is handcuffed.    Pt is in police custody.  Pt needs medical clearance.

## 2023-03-09 NOTE — ED Notes (Signed)
Pt refused VS at this.

## 2023-05-06 DIAGNOSIS — F1421 Cocaine dependence, in remission: Secondary | ICD-10-CM | POA: Insufficient documentation

## 2023-06-09 LAB — PANORAMA PRENATAL TEST FULL PANEL:PANORAMA TEST PLUS 5 ADDITIONAL MICRODELETIONS: FETAL FRACTION: 6.4

## 2023-06-21 DIAGNOSIS — O365990 Maternal care for other known or suspected poor fetal growth, unspecified trimester, not applicable or unspecified: Secondary | ICD-10-CM | POA: Insufficient documentation

## 2023-06-21 DIAGNOSIS — O35EXX0 Maternal care for other (suspected) fetal abnormality and damage, fetal genitourinary anomalies, not applicable or unspecified: Secondary | ICD-10-CM | POA: Insufficient documentation

## 2023-08-23 LAB — VISTARA: REPORT SUMMARY: NEGATIVE

## 2023-09-02 ENCOUNTER — Other Ambulatory Visit: Payer: Self-pay

## 2023-09-02 ENCOUNTER — Observation Stay
Admission: EM | Admit: 2023-09-02 | Discharge: 2023-09-02 | Disposition: A | Discharge status: Home / Self Care | Payer: MEDICAID | Attending: Obstetrics and Gynecology | Admitting: Obstetrics and Gynecology

## 2023-09-02 ENCOUNTER — Encounter: Payer: Self-pay | Admitting: Emergency Medicine

## 2023-09-02 DIAGNOSIS — O368130 Decreased fetal movements, third trimester, not applicable or unspecified: Secondary | ICD-10-CM | POA: Diagnosis present

## 2023-09-02 DIAGNOSIS — O368190 Decreased fetal movements, unspecified trimester, not applicable or unspecified: Principal | ICD-10-CM | POA: Diagnosis present

## 2023-09-02 DIAGNOSIS — Z3A33 33 weeks gestation of pregnancy: Secondary | ICD-10-CM | POA: Insufficient documentation

## 2023-09-02 NOTE — OB Triage Note (Signed)
Pt states that she has not felt baby move since 0600. Pt denies any other complaints at this time.

## 2023-09-02 NOTE — Discharge Summary (Signed)
LABOR & DELIVERY OB TRIAGE NOTE  SUBJECTIVE  HPI Kristina Gay is a 22 y.o. G1P0 at [redacted]w[redacted]d who presents to Labor & Delivery for decreased fetal movement. She reports feeling movement overnight but not at all while at work. Denies contractions, loss of fluid or vaginal bleeding. Since arrival to [facility] triage she reports feeling usual movements. Prenatal care received at [facility] with [facility].  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  OB History     Gravida  1   Para      Term      Preterm      AB      Living         SAB      IAB      Ectopic      Multiple      Live Births              OBJECTIVE  BP 125/72 (BP Location: Left Arm)   Pulse 94   Temp 98.2 F (36.8 C) (Oral)   Resp 18   Ht 5\' 1"  (1.549 m)   Wt 82.6 kg   LMP 01/14/2023 (Approximate)   BMI 34.39 kg/m   General: A&Ox4, NAD Heart: regular rate Lungs: normal work of breathing Abdomen: gravid, soft, nontender Cervical exam:   deferred  NST I reviewed the NST and it was reactive.  Baseline: 130 Variability: moderate Accelerations: present, 15x15 Decelerations:none Toco: quiet, soft resting tone Category I  ASSESSMENT Impression  1) Pregnancy at G1P0, [redacted]w[redacted]d, Estimated Date of Delivery: 10/21/23 2) Reassuring maternal/fetal status 3) Decreased fetal movement, resolved  PLAN Discharge home with preterm labor & fetal movement precautions, reviewed recommendation by MFM for delivery at [facility] & she affirms that is her plan. Maintain next visit 12/16 as scheduled.  Dominica Severin, CNM 09/02/23  10:47 PM
